# Patient Record
Sex: Female | Born: 1990
Health system: Southern US, Community
[De-identification: ages and names within clinical notes are randomized; demographics above are authoritative.]

## PROBLEM LIST (undated history)

## (undated) ENCOUNTER — Inpatient Hospital Stay (HOSPITAL_COMMUNITY): Payer: Self-pay

## (undated) DIAGNOSIS — G5600 Carpal tunnel syndrome, unspecified upper limb: Secondary | ICD-10-CM

## (undated) DIAGNOSIS — O26899 Other specified pregnancy related conditions, unspecified trimester: Secondary | ICD-10-CM

## (undated) HISTORY — PX: NO PAST SURGERIES: SHX2092

## (undated) HISTORY — DX: Other specified pregnancy related conditions, unspecified trimester: O26.899

## (undated) HISTORY — DX: Carpal tunnel syndrome, unspecified upper limb: G56.00

---
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
## 2013-12-02 ENCOUNTER — Telehealth: Payer: Self-pay | Admitting: *Deleted

## 2013-12-02 NOTE — Telephone Encounter (Addendum)
Pt request rx Naftin.   Last rx for Naftin was written 08/20/2012.  Informed pt, refills of Naftin 2% 90grams apply bid to affected area, ordered by Dr. Al CorpusHyatt for prn 1 year. Pt request orders to [facility] [address]  Called to [phone].

## 2013-12-02 NOTE — Telephone Encounter (Signed)
Ok you may refill

## 2013-12-03 ENCOUNTER — Encounter: Payer: Self-pay | Admitting: *Deleted

## 2015-10-13 LAB — OB RESULTS CONSOLE RUBELLA ANTIBODY, IGM: Rubella: IMMUNE

## 2015-10-13 LAB — OB RESULTS CONSOLE HEPATITIS B SURFACE ANTIGEN: HEP B S AG: NEGATIVE

## 2015-10-13 LAB — OB RESULTS CONSOLE GC/CHLAMYDIA
CHLAMYDIA, DNA PROBE: NEGATIVE
GC PROBE AMP, GENITAL: NEGATIVE

## 2015-10-13 LAB — OB RESULTS CONSOLE HIV ANTIBODY (ROUTINE TESTING): HIV: NONREACTIVE

## 2015-10-13 LAB — OB RESULTS CONSOLE ABO/RH: RH Type: POSITIVE

## 2015-10-13 LAB — OB RESULTS CONSOLE ANTIBODY SCREEN: ANTIBODY SCREEN: NEGATIVE

## 2015-10-13 LAB — OB RESULTS CONSOLE RPR: RPR: NONREACTIVE

## 2015-11-07 NOTE — L&D Delivery Note (Signed)
Delivery Note At 1:45 AM a viable female, "Brycen", was delivered via Vaginal, Spontaneous Delivery (Presentation: LOA).  APGAR: 9, 9; weight  .   Placenta status: Spontaneous, intact.  Cord: CAN x 1, removed over vtx at delivery, with the following complications: None  Cord pH: None  Anesthesia:  Epidural  Episiotomy:  None Lacerations:  Bilateral periurethral lacerations Suture Repair: 3.0 vicryl Est. Blood Loss (mL):  350  Mom to postpartum.  Baby to Couplet care / Skin to Skin. Family plans outpatient circumcision. Patient anticipates using progesterone-only OCPs for contraception.  Taela Charbonneau 06/10/2016, 2:25 AM

## 2016-05-11 LAB — OB RESULTS CONSOLE GBS: STREP GROUP B AG: POSITIVE

## 2016-06-05 ENCOUNTER — Telehealth (HOSPITAL_COMMUNITY): Payer: Self-pay | Admitting: *Deleted

## 2016-06-05 NOTE — Telephone Encounter (Signed)
Preadmission screen  

## 2016-06-06 ENCOUNTER — Telehealth (HOSPITAL_COMMUNITY): Payer: Self-pay | Admitting: *Deleted

## 2016-06-06 ENCOUNTER — Encounter (HOSPITAL_COMMUNITY): Payer: Self-pay | Admitting: *Deleted

## 2016-06-06 NOTE — Telephone Encounter (Signed)
Preadmission screen  

## 2016-06-07 ENCOUNTER — Other Ambulatory Visit: Payer: Self-pay | Admitting: Obstetrics and Gynecology

## 2016-06-09 ENCOUNTER — Inpatient Hospital Stay (HOSPITAL_COMMUNITY): Payer: Medicaid Other | Admitting: Anesthesiology

## 2016-06-09 ENCOUNTER — Inpatient Hospital Stay (HOSPITAL_COMMUNITY)
Admission: RE | Admit: 2016-06-09 | Discharge: 2016-06-12 | DRG: 775 | Disposition: A | Discharge status: Home / Self Care | Payer: Medicaid Other | Source: Ambulatory Visit | Attending: Obstetrics and Gynecology | Admitting: Obstetrics and Gynecology

## 2016-06-09 DIAGNOSIS — O99824 Streptococcus B carrier state complicating childbirth: Secondary | ICD-10-CM | POA: Diagnosis present

## 2016-06-09 DIAGNOSIS — Z833 Family history of diabetes mellitus: Secondary | ICD-10-CM

## 2016-06-09 DIAGNOSIS — O480 Post-term pregnancy: Secondary | ICD-10-CM | POA: Diagnosis present

## 2016-06-09 DIAGNOSIS — Z87891 Personal history of nicotine dependence: Secondary | ICD-10-CM | POA: Diagnosis not present

## 2016-06-09 DIAGNOSIS — O26899 Other specified pregnancy related conditions, unspecified trimester: Secondary | ICD-10-CM | POA: Diagnosis not present

## 2016-06-09 DIAGNOSIS — Z6836 Body mass index (BMI) 36.0-36.9, adult: Secondary | ICD-10-CM

## 2016-06-09 DIAGNOSIS — E669 Obesity, unspecified: Secondary | ICD-10-CM | POA: Diagnosis present

## 2016-06-09 DIAGNOSIS — O99214 Obesity complicating childbirth: Secondary | ICD-10-CM | POA: Diagnosis present

## 2016-06-09 DIAGNOSIS — O09899 Supervision of other high risk pregnancies, unspecified trimester: Secondary | ICD-10-CM | POA: Diagnosis present

## 2016-06-09 DIAGNOSIS — G5600 Carpal tunnel syndrome, unspecified upper limb: Secondary | ICD-10-CM | POA: Diagnosis present

## 2016-06-09 DIAGNOSIS — B951 Streptococcus, group B, as the cause of diseases classified elsewhere: Secondary | ICD-10-CM | POA: Diagnosis present

## 2016-06-09 DIAGNOSIS — Z3A41 41 weeks gestation of pregnancy: Secondary | ICD-10-CM | POA: Diagnosis not present

## 2016-06-09 DIAGNOSIS — Z283 Underimmunization status: Secondary | ICD-10-CM

## 2016-06-09 DIAGNOSIS — Z2839 Other underimmunization status: Secondary | ICD-10-CM | POA: Diagnosis present

## 2016-06-09 DIAGNOSIS — Z3490 Encounter for supervision of normal pregnancy, unspecified, unspecified trimester: Secondary | ICD-10-CM | POA: Diagnosis present

## 2016-06-09 LAB — TYPE AND SCREEN
ABO/RH(D): O POS
ANTIBODY SCREEN: NEGATIVE

## 2016-06-09 LAB — PROTEIN / CREATININE RATIO, URINE
Creatinine, Urine: 98 mg/dL
PROTEIN CREATININE RATIO: 0.14 mg/mg{creat} (ref 0.00–0.15)
Total Protein, Urine: 14 mg/dL

## 2016-06-09 LAB — CBC
HEMATOCRIT: 37.8 % (ref 36.0–46.0)
Hemoglobin: 13 g/dL (ref 12.0–15.0)
MCH: 30.4 pg (ref 26.0–34.0)
MCHC: 34.4 g/dL (ref 30.0–36.0)
MCV: 88.3 fL (ref 78.0–100.0)
PLATELETS: 163 10*3/uL (ref 150–400)
RBC: 4.28 MIL/uL (ref 3.87–5.11)
RDW: 14.5 % (ref 11.5–15.5)
WBC: 9.6 10*3/uL (ref 4.0–10.5)

## 2016-06-09 LAB — COMPREHENSIVE METABOLIC PANEL
ALBUMIN: 2.9 g/dL — AB (ref 3.5–5.0)
ALK PHOS: 199 U/L — AB (ref 38–126)
ALT: 28 U/L (ref 14–54)
ANION GAP: 8 (ref 5–15)
AST: 23 U/L (ref 15–41)
BUN: 11 mg/dL (ref 6–20)
CO2: 19 mmol/L — AB (ref 22–32)
Calcium: 8.9 mg/dL (ref 8.9–10.3)
Chloride: 103 mmol/L (ref 101–111)
Creatinine, Ser: 0.55 mg/dL (ref 0.44–1.00)
GFR calc Af Amer: >60 mL/min (ref >60)
GFR calc non Af Amer: >60 mL/min (ref >60)
GLUCOSE: 91 mg/dL (ref 65–99)
POTASSIUM: 4 mmol/L (ref 3.5–5.1)
SODIUM: 130 mmol/L — AB (ref 135–145)
Total Bilirubin: 0.6 mg/dL (ref 0.3–1.2)
Total Protein: 6.2 g/dL — ABNORMAL LOW (ref 6.5–8.1)

## 2016-06-09 LAB — LACTATE DEHYDROGENASE: LDH: 130 U/L (ref 98–192)

## 2016-06-09 LAB — ABO/RH: ABO/RH(D): O POS

## 2016-06-09 LAB — URIC ACID: Uric Acid, Serum: 3.9 mg/dL (ref 2.3–6.6)

## 2016-06-09 LAB — RPR: RPR Ser Ql: NONREACTIVE

## 2016-06-09 MED ORDER — ZOLPIDEM TARTRATE 5 MG PO TABS
5.0000 mg | ORAL_TABLET | Freq: Every evening | ORAL | Status: DC | PRN
  Administered 2016-06-09: 5 mg via ORAL
  Filled 2016-06-09: qty 1

## 2016-06-09 MED ORDER — LIDOCAINE HCL (PF) 1 % IJ SOLN
INTRAMUSCULAR | Status: DC | PRN
  Administered 2016-06-09 (×2): 5 mL via EPIDURAL

## 2016-06-09 MED ORDER — TERBUTALINE SULFATE 1 MG/ML IJ SOLN: 0.2500 mg | Freq: Once | INTRAMUSCULAR | Status: DC | PRN

## 2016-06-09 MED ORDER — DIPHENHYDRAMINE HCL 50 MG/ML IJ SOLN: 12.5000 mg | INTRAMUSCULAR | Status: DC | PRN

## 2016-06-09 MED ORDER — LIDOCAINE HCL (PF) 1 % IJ SOLN
30.0000 mL | INTRAMUSCULAR | Status: AC | PRN
  Administered 2016-06-10: 30 mL via SUBCUTANEOUS
  Filled 2016-06-09: qty 30

## 2016-06-09 MED ORDER — EPHEDRINE 5 MG/ML INJ
10.0000 mg | INTRAVENOUS | Status: DC | PRN
Start: 2016-06-09 — End: 2016-06-09

## 2016-06-09 MED ORDER — OXYTOCIN 40 UNITS IN LACTATED RINGERS INFUSION - SIMPLE MED: 1.0000 m[IU]/min | INTRAVENOUS | Status: DC

## 2016-06-09 MED ORDER — OXYTOCIN BOLUS FROM INFUSION
500.0000 mL | Freq: Once | INTRAVENOUS | Status: AC
  Administered 2016-06-10: 500 mL via INTRAVENOUS

## 2016-06-09 MED ORDER — LACTATED RINGERS IV SOLN: 500.0000 mL | Freq: Once | INTRAVENOUS | Status: DC

## 2016-06-09 MED ORDER — FENTANYL 2.5 MCG/ML BUPIVACAINE 1/10 % EPIDURAL INFUSION (WH - ANES)
14.0000 mL/h | INTRAMUSCULAR | Status: DC | PRN
  Administered 2016-06-09 (×2): 14 mL/h via EPIDURAL
  Filled 2016-06-09 (×3): qty 125

## 2016-06-09 MED ORDER — MISOPROSTOL 25 MCG QUARTER TABLET
25.0000 ug | ORAL_TABLET | ORAL | Status: DC | PRN
  Administered 2016-06-09 (×3): 25 ug via VAGINAL
  Filled 2016-06-09 (×3): qty 0.25

## 2016-06-09 MED ORDER — LACTATED RINGERS IV SOLN
500.0000 mL | Freq: Once | INTRAVENOUS | Status: AC
  Administered 2016-06-09: 500 mL via INTRAVENOUS

## 2016-06-09 MED ORDER — PHENYLEPHRINE 40 MCG/ML (10ML) SYRINGE FOR IV PUSH (FOR BLOOD PRESSURE SUPPORT)
80.0000 ug | PREFILLED_SYRINGE | INTRAVENOUS | Status: DC | PRN
  Filled 2016-06-09: qty 10

## 2016-06-09 MED ORDER — PENICILLIN G POTASSIUM 5000000 UNITS IJ SOLR: 2.5000 10*6.[IU] | INTRAVENOUS | Status: DC

## 2016-06-09 MED ORDER — OXYTOCIN 40 UNITS IN LACTATED RINGERS INFUSION - SIMPLE MED
1.0000 m[IU]/min | INTRAVENOUS | Status: DC
Start: 2016-06-09 — End: 2016-06-10
  Administered 2016-06-09: 1 m[IU]/min via INTRAVENOUS
  Filled 2016-06-09: qty 1000

## 2016-06-09 MED ORDER — OXYTOCIN 40 UNITS IN LACTATED RINGERS INFUSION - SIMPLE MED: 2.5000 [IU]/h | INTRAVENOUS | Status: DC

## 2016-06-09 MED ORDER — ZOLPIDEM TARTRATE 5 MG PO TABS: 5.0000 mg | ORAL_TABLET | Freq: Every evening | ORAL | Status: DC | PRN

## 2016-06-09 MED ORDER — EPHEDRINE 5 MG/ML INJ: 10.0000 mg | INTRAVENOUS | Status: DC | PRN

## 2016-06-09 MED ORDER — LACTATED RINGERS IV SOLN
INTRAVENOUS | Status: DC
  Administered 2016-06-09: 06:00:00 via INTRAVENOUS
  Administered 2016-06-09: 500 mL via INTRAVENOUS
  Administered 2016-06-09 (×2): via INTRAVENOUS

## 2016-06-09 MED ORDER — LACTATED RINGERS IV SOLN: 500.0000 mL | INTRAVENOUS | Status: DC | PRN

## 2016-06-09 MED ORDER — DEXTROSE 5 % IV SOLN
5.0000 10*6.[IU] | Freq: Once | INTRAVENOUS | Status: AC
  Administered 2016-06-09: 5 10*6.[IU] via INTRAVENOUS
  Filled 2016-06-09: qty 5

## 2016-06-09 MED ORDER — PHENYLEPHRINE 40 MCG/ML (10ML) SYRINGE FOR IV PUSH (FOR BLOOD PRESSURE SUPPORT): 80.0000 ug | PREFILLED_SYRINGE | INTRAVENOUS | Status: DC | PRN

## 2016-06-09 MED ORDER — FENTANYL CITRATE (PF) 100 MCG/2ML IJ SOLN
50.0000 ug | INTRAMUSCULAR | Status: DC | PRN
  Administered 2016-06-09 (×2): 100 ug via INTRAVENOUS
  Filled 2016-06-09 (×2): qty 2

## 2016-06-09 MED ORDER — FENTANYL 2.5 MCG/ML BUPIVACAINE 1/10 % EPIDURAL INFUSION (WH - ANES): 14.0000 mL/h | INTRAMUSCULAR | Status: DC | PRN

## 2016-06-09 MED ORDER — FLEET ENEMA 7-19 GM/118ML RE ENEM: 1.0000 | ENEMA | RECTAL | Status: DC | PRN

## 2016-06-09 MED ORDER — ONDANSETRON HCL 4 MG/2ML IJ SOLN: 4.0000 mg | Freq: Four times a day (QID) | INTRAMUSCULAR | Status: DC | PRN

## 2016-06-09 MED ORDER — PENICILLIN G POTASSIUM 5000000 UNITS IJ SOLR
2.5000 10*6.[IU] | INTRAVENOUS | Status: DC
  Administered 2016-06-09 – 2016-06-10 (×3): 2.5 10*6.[IU] via INTRAVENOUS
  Filled 2016-06-09 (×6): qty 2.5

## 2016-06-09 MED ORDER — PHENYLEPHRINE 40 MCG/ML (10ML) SYRINGE FOR IV PUSH (FOR BLOOD PRESSURE SUPPORT)
80.0000 ug | PREFILLED_SYRINGE | INTRAVENOUS | Status: DC | PRN
Start: 2016-06-09 — End: 2016-06-09

## 2016-06-09 MED ORDER — ACETAMINOPHEN 325 MG PO TABS: 650.0000 mg | ORAL_TABLET | ORAL | Status: DC | PRN

## 2016-06-09 MED ORDER — SOD CITRATE-CITRIC ACID 500-334 MG/5ML PO SOLN: 30.0000 mL | ORAL | Status: DC | PRN

## 2016-06-09 NOTE — Anesthesia Pain Management Evaluation Note (Signed)
  CRNA Pain Management Visit Note  Patient: Deborah Lozano, 25 y.o., female  "Hello I am a member of the anesthesia team at [facility]. We have an anesthesia team available at all times to provide care throughout the hospital, including epidural management and anesthesia for C-section. I don't know your plan for the delivery whether it a natural birth, water birth, IV sedation, nitrous supplementation, doula or epidural, but we want to meet your pain goals."   1.Was your pain managed to your expectations on prior hospitalizations?   Yes   2.What is your expectation for pain management during this hospitalization?     Epidural  3.How can we help you reach that goal? epidural  Record the patient's initial score and the patient's pain goal.   Pain: 0  Pain Goal: 8 [facility] wants you to be able to say your pain was always managed very well.  Salome Arnt 06/09/2016

## 2016-06-09 NOTE — Progress Notes (Signed)
  Subjective: Sleeping, yet easily aroused. Ambien helpful. Feeling ctxs, but bearable. +FM. Denies HA, visual changes, epigastric pain or difficulty breathing.   Objective: BP 133/71   Pulse 99   Temp 98.6 F (37 C) (Oral)   Resp 18   Ht 5\' 5"  (1.651 m)   Wt 98.9 kg (218 lb)   BMI 36.28 kg/m   Today's Vitals   06/09/16 0500 06/09/16 0502 06/09/16 0532 06/09/16 0602  BP:  133/64 (!) 143/73 133/71  Pulse:  99 97 99  Resp:  18 16 18   Temp:    98.6 F (37 C)  TempSrc:    Oral  Weight:      Height:      PainSc: Asleep      No intake/output data recorded. No intake/output data recorded.  FHT: BL 150 w/ moderate variability, +accels, few, non-repetitive mild variables, no lates UC:   irregular, every 1-7 minutes SVE:   Dilation: 1.5 Effacement (%): 60 Station: -3 Exam by:: B Stalling RN@ [id number]  Cytotec #2 placed at 0614; cvx unchanged.  Assessment:  Elevated BPs Overall reassuring FHRT  Plan: preE labs now  Sherre Scarlet CNM 06/09/2016, 6:29 AM

## 2016-06-09 NOTE — Anesthesia Preprocedure Evaluation (Signed)
Anesthesia Evaluation  Patient identified by MRN, date of birth, ID band Patient awake    Reviewed: Allergy & Precautions, H&P , NPO status , Patient's Chart, lab work & pertinent test results  Airway Mallampati: II  TM Distance: >3 FB Neck ROM: full    Dental no notable dental hx. (+) Teeth Intact, Dental Advisory Given   Pulmonary neg pulmonary ROS, former smoker,    Pulmonary exam normal breath sounds clear to auscultation       Cardiovascular Exercise Tolerance: Good negative cardio ROS Normal cardiovascular exam Rhythm:regular Rate:Normal     Neuro/Psych negative neurological ROS  negative psych ROS   GI/Hepatic negative GI ROS, Neg liver ROS,   Endo/Other  negative endocrine ROS  Renal/GU negative Renal ROS  negative genitourinary   Musculoskeletal   Abdominal   Peds  Hematology negative hematology ROS (+)   Anesthesia Other Findings   Reproductive/Obstetrics (+) Pregnancy                             Anesthesia Physical Anesthesia Plan  ASA: II  Anesthesia Plan: Epidural   Post-op Pain Management:    Induction:   Airway Management Planned:   Additional Equipment:   Intra-op Plan:   Post-operative Plan:   Informed Consent: I have reviewed the patients History and Physical, chart, labs and discussed the procedure including the risks, benefits and alternatives for the proposed anesthesia with the patient or authorized representative who has indicated his/her understanding and acceptance.   Dental Advisory Given  Plan Discussed with: CRNA  Anesthesia Plan Comments:         Anesthesia Quick Evaluation

## 2016-06-09 NOTE — H&P (Signed)
Deborah Lozano is a 25 y.o. female, G2P0010 @ 41.0 wks estimated gestational age (as dated by LMP (08/27/15) c/w 22.6 week ultrasound, EDD 06/02/16), presenting for IOL due to late term pregnancy. Endorses FM and ctxs. Denies HA, visual changes, epigastric pain, difficulty breathing, VB or LOF.  Pt entered care at [facility] at 10 wks.  Prenatal course c/b: GBS positive status Obesity (BMI 35.8) -- TWG 79 lbs Varicella non-immune Carpal tunnel syndrome Insomnia  OB History    Gravida Para Term Preterm AB Living   1             SAB TAB Ectopic Multiple Live Births                TAB on 07/15/14 @ 4 wks Past Medical History:  Diagnosis Date  . Carpal tunnel syndrome during pregnancy   . Medical history non-contributory    Past Surgical History:  Procedure Laterality Date  . NO PAST SURGERIES     Family History: family history includes Cancer in her paternal aunt; Diabetes in her maternal grandfather, paternal aunt, and paternal grandmother. Social History:  reports that she quit smoking about a year ago. She has never used smokeless tobacco. She reports that she does not drink alcohol or use drugs.     Maternal Diabetes: No Genetic Screening: Declined Maternal Ultrasounds/Referrals: Normal -- Anatomy: EFW 1+3, 40%ILE. NORMAL FLUID. ANTERIOR PLACENTA. FEMALE FETUS. LIMITED ANATOMY OF FACE AND HEART -- REPEATED AT 25.6 WKS - ALL ANATOMY SEEN - COMPLETED. Fetal Ultrasounds or other Referrals:  None Maternal Substance Abuse:  No Significant Maternal Medications:  Meds include: Other: PNV Significant Maternal Lab Results:  Lab values include: Group B Strep positive, varicella non-immune Other Comments:  Declined both flu and Tdap  ROS 10 Systems reviewed and are negative for acute change except as noted in the HPI.    Maternal Medical History:  Contractions: Onset was 1 week or more ago.   Frequency: irregular.   Duration is approximately 60 seconds.   Perceived severity is mild.     Fetal activity: Perceived fetal activity is normal.   Last perceived fetal movement was within the past hour.    Prenatal complications: No bleeding.   Prenatal Complications - Diabetes: none.    Dilation: 1.5 Effacement (%): 60 Station: -3 Exam by:: Leann RN@ 0156 Blood pressure 126/62, pulse 90, temperature 98.5 F (36.9 C), temperature source Oral, resp. rate 16, height  (1.651 m), weight 98.9 kg (218 lb). Maternal Exam:  Uterine Assessment: Contraction strength is mild.  Contraction duration is 60 seconds. Contraction frequency is irregular.   Abdomen: Patient reports no abdominal tenderness. Fundal height is CWD.   Estimated fetal weight is 7 1/2 - 8 lbs.   Fetal presentation: vertex  Introitus: Normal vulva. Normal vagina.  Pelvis: adequate for delivery.   Cervix: Cervix evaluated by digital exam.     Fetal Exam Fetal Monitor Review: Mode: fetoscope.   Baseline rate: 135.  Variability: moderate (6-25 bpm).   Pattern: accelerations present and no decelerations.    Fetal State Assessment: Category I - tracings are normal.     Physical Exam  Nursing note and vitals reviewed. Constitutional: She is oriented to person, place, and time. She appears well-developed and well-nourished. No distress.  HENT:  Head: Normocephalic and atraumatic.  Neck: Normal range of motion. Neck supple.  Cardiovascular: Normal rate, regular rhythm and normal heart sounds.  Exam reveals no gallop and no friction rub.   No  murmur heard. Respiratory: Effort normal and breath sounds normal.  GI: Soft. She exhibits no distension and no mass. There is no tenderness. There is no rebound and no guarding.  Genitourinary: Vagina normal and uterus normal.  Musculoskeletal: Normal range of motion. She exhibits edema.  Neurological: She is alert and oriented to person, place, and time. She has normal reflexes.  Skin: Skin is warm and dry.  Psychiatric: She has a normal mood and affect.    Bishop score: 2   Prenatal labs: ABO, Rh: --/--/O POS, O POS (08/04 0107) Antibody: NEG (08/04 0107) Rubella: Immune (12/07 0000) RPR: Nonreactive (12/07 0000)  HBsAg: Negative (12/07 0000)  HIV: Non-reactive (12/07 0000)  GBS: Positive (07/06 0000)  Varicella: Non-immune  Assessment: 25 yo G2P0010 @ 41.0 wks IOL due to late term pregnancy Latent phase labor FWB: Cat 1 GBS positive Unfavorable cvx Varicella non-immune Obesity Elevated BP x1 noted on admission    Plan: Admit to [facility] per consult with Dr. Normand Sloop. Routine [facility] orders. Ambien for sleep, however, not if pain med desired. Epidural upon request. Reviewed plan of care with patient & FOB ([place]), including rationale and processes of induction, to include Cytotec, foley bulb, pitocin and AROM. Risks and benefits of induction were reviewed, including failure of method, prolonged labor, need for further intervention, and risk of cesarean.Patient and FOB seem to understand these risks and wish to proceed. Will begin induction w/ Cytotec.  Monitor BPs closely, and if more elevations, plan to rule out preeclampsia.Pt currently asymptomatic. PCN G for GBS prophylaxis per standard dosing with ROM, active labor or initiation of Pitocin.  Consult as indicated. Offer varicella vaccine pp. Expect progress and SVD.    Sherre Scarlet 06/09/2016, 4:07 AM

## 2016-06-09 NOTE — Progress Notes (Signed)
25 y.o. year old female,at [redacted]w[redacted]d gestation.  SUBJECTIVE:  C/O painful contractions.  OBJECTIVE:  BP (!) 161/98   Pulse 100   Temp 97.7 F (36.5 C) (Axillary)   Resp 18   Ht 5\' 5"  (1.651 m)   Wt 218 lb (98.9 kg)   BMI 36.28 kg/m   Fetal Heart Tones:  Cat 1  Contractions:          firm  Cx 5-6 cm according to the nurse  Results for orders placed or performed during the hospital encounter of 06/09/16 (from the past 24 hour(s))  CBC     Status: None   Collection Time: 06/09/16  1:07 AM  Result Value Ref Range   WBC 9.6 4.0 - 10.5 K/uL   RBC 4.28 3.87 - 5.11 MIL/uL   Hemoglobin 13.0 12.0 - 15.0 g/dL   HCT 07.8 67.5 - 44.9 %   MCV 88.3 78.0 - 100.0 fL   MCH 30.4 26.0 - 34.0 pg   MCHC 34.4 30.0 - 36.0 g/dL   RDW 20.1 00.7 - 12.1 %   Platelets 163 150 - 400 K/uL  RPR     Status: None   Collection Time: 06/09/16  1:07 AM  Result Value Ref Range   RPR Ser Ql Non Reactive Non Reactive  Type and screen     Status: None   Collection Time: 06/09/16  1:07 AM  Result Value Ref Range   ABO/RH(D) O POS    Antibody Screen NEG    Sample Expiration 06/12/2016   ABO/Rh     Status: None   Collection Time: 06/09/16  1:07 AM  Result Value Ref Range   ABO/RH(D) O POS   Comprehensive metabolic panel     Status: Abnormal   Collection Time: 06/09/16  6:59 AM  Result Value Ref Range   Sodium 130 (L) 135 - 145 mmol/L   Potassium 4.0 3.5 - 5.1 mmol/L   Chloride 103 101 - 111 mmol/L   CO2 19 (L) 22 - 32 mmol/L   Glucose, Bld 91 65 - 99 mg/dL   BUN 11 6 - 20 mg/dL   Creatinine, Ser 9.75 0.44 - 1.00 mg/dL   Calcium 8.9 8.9 - 88.3 mg/dL   Total Protein 6.2 (L) 6.5 - 8.1 g/dL   Albumin 2.9 (L) 3.5 - 5.0 g/dL   AST 23 15 - 41 U/L   ALT 28 14 - 54 U/L   Alkaline Phosphatase 199 (H) 38 - 126 U/L   Total Bilirubin 0.6 0.3 - 1.2 mg/dL   GFR calc non Af Amer >60 >60 mL/min   GFR calc Af Amer >60 >60 mL/min   Anion gap 8 5 - 15  Lactate dehydrogenase     Status: None   Collection Time:  06/09/16  6:59 AM  Result Value Ref Range   LDH 130 98 - 192 U/L  Uric acid     Status: None   Collection Time: 06/09/16  6:59 AM  Result Value Ref Range   Uric Acid, Serum 3.9 2.3 - 6.6 mg/dL    ASSESSMENT:  [redacted]w[redacted]d Weeks Pregnancy  AROM - clear fluid earlier.  IUPC and FSE applied  PLAN:  The patietn requests an epidural for pain management.  Anticipate a vaginal delivery.  Leonard Schwartz, M.D.

## 2016-06-09 NOTE — Progress Notes (Signed)
  Subjective: Comfortable with epidural, feeling slight vagina pressure.  Family and FOB at bedside.  Objective: BP 137/81   Pulse 90   Temp 98 F (36.7 C) (Axillary)   Resp 18   Ht 5\' 5"  (1.651 m)   Wt 98.9 kg (218 lb)   BMI 36.28 kg/m  I/O last 3 completed shifts: In: 400 [P.O.:300; IV Piggyback:100] Out: 1000 [Urine:1000] No intake/output data recorded.   Vitals:   06/09/16 1730 06/09/16 1800 06/09/16 1830 06/09/16 1900  BP: 136/86 119/62 133/79 137/81  Pulse: 100 90 (!) 105 90  Resp: 18 18 18 18   Temp:  98 F (36.7 C)    TempSrc:  Axillary    Weight:      Height:        FHT: Category--segments of Category 1, segments of decreased variability, early decels UC:   regular, every 2-4 minutes SVE:   Dilation: 6.5 Effacement (%): 90 Station: -1, 0 Exam by:: Lajuana Matte, RN at 7pm MVUs 200-250 since 1630. FSE in place  Assessment:  Active labor, with adequate contractions GBS positive BMI 35.8 Mildly elevated BP earlier today--normal PIH labs, no PCR done  Plan: Check PCR Continue to observe for labor progress. Position to facilitate rotation/descent. Augment prn. Recheck cervix in 2 hours or prn  Kathan Kirker CNM 06/09/2016, 7:40p

## 2016-06-09 NOTE — Anesthesia Procedure Notes (Addendum)
Epidural Patient location during procedure: OB Start time: 06/09/2016 2:55 PM End time: 06/09/2016 3:05 PM  Staffing Anesthesiologist: Ronelle Nigh Performed: anesthesiologist   Preanesthetic Checklist Completed: patient identified, site marked, surgical consent, pre-op evaluation, timeout performed, IV checked, risks and benefits discussed and monitors and equipment checked  Epidural Patient position: sitting Prep: site prepped and draped and DuraPrep Patient monitoring: continuous pulse ox and blood pressure Approach: midline Location: L3-L4 Injection technique: LOR air  Needle:  Needle type: Tuohy  Needle gauge: 17 G Needle length: 9 cm and 9 Needle insertion depth: 7 cm Catheter type: closed end flexible Catheter size: 19 Gauge Catheter at skin depth: 13 cm Test dose: negative  Assessment Sensory level: T10 Events: blood not aspirated, injection not painful, no injection resistance, negative IV test and no paresthesia  Additional Notes Patient identified. Risks/Benefits/Options discussed with patient including but not limited to bleeding, infection, nerve damage, paralysis, failed block, incomplete pain control, headache, blood pressure changes, nausea, vomiting, reactions to medication both or allergic, itching and postpartum back pain. Confirmed with bedside nurse the patient's most recent platelet count. Confirmed with patient that they are not currently taking any anticoagulation, have any bleeding history or any family history of bleeding disorders. Patient expressed understanding and wished to proceed. All questions were answered. Sterile technique was used throughout the entire procedure. Please see nursing notes for vital signs. Test dose was given through epidural catheter and negative prior to continuing to dose epidural or start infusion. Warning signs of high block given to the patient including shortness of breath, tingling/numbness in hands, complete motor block, or  any concerning symptoms with instructions to call for help. Patient was given instructions on fall risk and not to get out of bed. All questions and concerns addressed with instructions to call with any issues or inadequate analgesia.

## 2016-06-09 NOTE — Progress Notes (Signed)
  Subjective: Comfortable with epidural.  Objective: BP 137/81   Pulse 90   Temp 97.7 F (36.5 C) (Oral)   Resp 18   Ht 5\' 5"  (1.651 m)   Wt 98.9 kg (218 lb)   BMI 36.28 kg/m  I/O last 3 completed shifts: In: 400 [P.O.:300; IV Piggyback:100] Out: 1000 [Urine:1000] No intake/output data recorded.  FHT: Category 1 UC:   Coupling noted SVE:   Dilation: 7 Effacement (%): 100 Station: -1 Exam by:: V.Kohler Pellerito, CNM   Assessment:  Active labor, coupling of contractions.  Plan: Augment with pitocin Position to facilitate rotation/descent.  Nigel Bridgeman CNM 06/09/2016, 9:53 PM

## 2016-06-10 ENCOUNTER — Encounter (HOSPITAL_COMMUNITY): Payer: Self-pay | Admitting: Anesthesiology

## 2016-06-10 ENCOUNTER — Encounter (HOSPITAL_COMMUNITY): Payer: Self-pay

## 2016-06-10 MED ORDER — BENZOCAINE-MENTHOL 20-0.5 % EX AERO
1.0000 "application " | INHALATION_SPRAY | CUTANEOUS | Status: DC | PRN
  Administered 2016-06-10: 1 via TOPICAL
  Filled 2016-06-10 (×2): qty 56

## 2016-06-10 MED ORDER — TETANUS-DIPHTH-ACELL PERTUSSIS 5-2.5-18.5 LF-MCG/0.5 IM SUSP: 0.5000 mL | Freq: Once | INTRAMUSCULAR | Status: DC

## 2016-06-10 MED ORDER — COCONUT OIL OIL
1.0000 "application " | TOPICAL_OIL | Status: DC | PRN
  Administered 2016-06-11: 1 via TOPICAL
  Filled 2016-06-10 (×2): qty 120

## 2016-06-10 MED ORDER — ACETAMINOPHEN 325 MG PO TABS: 650.0000 mg | ORAL_TABLET | ORAL | Status: DC | PRN

## 2016-06-10 MED ORDER — WITCH HAZEL-GLYCERIN EX PADS: 1.0000 "application " | MEDICATED_PAD | CUTANEOUS | Status: DC | PRN

## 2016-06-10 MED ORDER — OXYCODONE HCL 5 MG PO TABS: 10.0000 mg | ORAL_TABLET | ORAL | Status: DC | PRN

## 2016-06-10 MED ORDER — PRENATAL MULTIVITAMIN CH: 1.0000 | ORAL_TABLET | Freq: Every day | ORAL | Status: DC

## 2016-06-10 MED ORDER — ONDANSETRON HCL 4 MG PO TABS: 4.0000 mg | ORAL_TABLET | ORAL | Status: DC | PRN

## 2016-06-10 MED ORDER — PRENATAL MULTIVITAMIN CH
1.0000 | ORAL_TABLET | Freq: Every day | ORAL | Status: DC
  Administered 2016-06-10 – 2016-06-12 (×3): 1 via ORAL
  Filled 2016-06-10 (×3): qty 1

## 2016-06-10 MED ORDER — SIMETHICONE 80 MG PO CHEW: 80.0000 mg | CHEWABLE_TABLET | ORAL | Status: DC | PRN

## 2016-06-10 MED ORDER — OXYCODONE HCL 5 MG PO TABS: 5.0000 mg | ORAL_TABLET | ORAL | Status: DC | PRN

## 2016-06-10 MED ORDER — ZOLPIDEM TARTRATE 5 MG PO TABS: 5.0000 mg | ORAL_TABLET | Freq: Every evening | ORAL | Status: DC | PRN

## 2016-06-10 MED ORDER — IBUPROFEN 600 MG PO TABS
600.0000 mg | ORAL_TABLET | Freq: Four times a day (QID) | ORAL | Status: DC
  Administered 2016-06-10 – 2016-06-12 (×10): 600 mg via ORAL
  Filled 2016-06-10 (×10): qty 1

## 2016-06-10 MED ORDER — DIPHENHYDRAMINE HCL 25 MG PO CAPS: 25.0000 mg | ORAL_CAPSULE | Freq: Four times a day (QID) | ORAL | Status: DC | PRN

## 2016-06-10 MED ORDER — ONDANSETRON HCL 4 MG/2ML IJ SOLN: 4.0000 mg | INTRAMUSCULAR | Status: DC | PRN

## 2016-06-10 MED ORDER — DIBUCAINE 1 % RE OINT
1.0000 "application " | TOPICAL_OINTMENT | RECTAL | Status: DC | PRN
  Filled 2016-06-10: qty 56.7

## 2016-06-10 MED ORDER — SENNOSIDES-DOCUSATE SODIUM 8.6-50 MG PO TABS
2.0000 | ORAL_TABLET | ORAL | Status: DC
  Administered 2016-06-10 – 2016-06-12 (×2): 2 via ORAL
  Filled 2016-06-10 (×2): qty 2

## 2016-06-10 NOTE — Lactation Note (Addendum)
This note was copied from a baby's chart. Lactation Consultation Note Initial consult with this mom and term baby, now 23 hours old. Mom has flat nipples and has had trouble getting this term baby to latch. I first tried a 24 shield, but it would lose suction. The 20 shield worked better. Aggie Hacker was on and off both breasts, for 60 minutes, with about 15 minutes of actual, strong sucking. He was able to take about 4 ml's of EBM by me placing it into the nipple shield with curved tip syringe. He was actively trying to latch after 30 minutes or so. I left mom with him latched in lay back position, and dad was helping to protect baby's airway - mom sleepy, breast soft and full.  DEP set up and mom is to begin pumping after she eats and naps. Mom may need help applying nipple shield later, and Chalmers Guest., LC, aware. Mom has lots of easily expressed colostrum. She knows to call for questions/concerns. Dad is [place], who works Office manager, 3rd [employer].  I noted the baby has a short, thin frenulum, just behind his tongue tip, but he does well with latching deeply with 20 nipple shield.   Patient Name: Deborah Lozano [id number] Date: 06/10/2016 Reason for consult: Initial assessment   Maternal Data Formula Feeding for Exclusion: No Has patient been taught Hand Expression?: Yes Does the patient have breastfeeding experience prior to this delivery?: No  Feeding Feeding Type: Breast Fed Length of feed: 60 min (on and off - about 15 minutes of sucking)  LATCH Score/Interventions Latch: Repeated attempts needed to sustain latch, nipple held in mouth throughout feeding, stimulation needed to elicit sucking reflex. Intervention(s): Adjust position;Assist with latch;Breast massage;Breast compression  Audible Swallowing: A few with stimulation (EBM in nipple shield) Intervention(s): Skin to skin;Hand expression  Type of Nipple: Flat (20 nipple shiled great help) Intervention(s): Shells (shells in room, will educate  mom about shells tomrrow. )  Comfort (Breast/Nipple): Soft / non-tender     Hold (Positioning): Assistance needed to correctly position infant at breast and maintain latch. Intervention(s): Breastfeeding basics reviewed;Support Pillows;Position options;Skin to skin  LATCH Score: 6  Lactation Tools Discussed/Used WIC Program: Yes Pump Review: Setup, frequency, and cleaning;Milk Storage;Other (comment) (nipple shiled application and care, pump use and settings, hand expression) Initiated by:: Danton Clap, RN, IBCLC Date initiated:: 06/10/16   Consult Status Consult Status: Follow-up Date: 06/11/16 Follow-up type: In-patient    Alfred Levins 06/10/2016, 4:48 PM

## 2016-06-10 NOTE — Anesthesia Postprocedure Evaluation (Signed)
Anesthesia Post Note  Patient: Deborah Lozano  Procedure(s) Performed: * No procedures listed *  Patient location during evaluation: Mother Baby Anesthesia Type: Epidural Level of consciousness: awake and alert Pain management: pain level controlled Vital Signs Assessment: post-procedure vital signs reviewed and stable Respiratory status: spontaneous breathing, nonlabored ventilation and respiratory function stable Cardiovascular status: stable Postop Assessment: no headache, no backache, epidural receding and patient able to bend at knees Anesthetic complications: no     Last Vitals:  Vitals:   06/10/16 1032 06/10/16 1500  BP: 136/65 130/63  Pulse: 93 100  Resp: 18 18  Temp: 36.8 C 37.1 C    Last Pain:  Vitals:   06/10/16 1551  TempSrc:   PainSc: 2    Pain Goal: Patients Stated Pain Goal: 0 (06/09/16 2001)               Rica Records

## 2016-06-10 NOTE — Progress Notes (Signed)
Postpartum day #0, NSVD  Subjective Pt without complaints.  Lochia normal.  Pain controlled.  Breast feeding yes  Temp:  [97.1 F (36.2 C)-99.5 F (37.5 C)] 98.2 F (36.8 C) (08/05 1032) Pulse Rate:  [84-109] 93 (08/05 1032) Resp:  [16-20] 18 (08/05 1032) BP: (112-163)/(43-98) 136/65 (08/05 1032)  Gen:  NAD, A&O x 3 Uterine fundus:  Firm, nontender Lochia normal Ext:  2+Edema, no calf tenderness bilaterally  CBC    Component Value Date/Time   WBC 9.6 06/09/2016 0107   RBC 4.28 06/09/2016 0107   HGB 13.0 06/09/2016 0107   HCT 37.8 06/09/2016 0107   PLT 163 06/09/2016 0107   MCV 88.3 06/09/2016 0107   MCH 30.4 06/09/2016 0107   MCHC 34.4 06/09/2016 0107   RDW 14.5 06/09/2016 0107     A/P: S/p SVD doing well. Routine postpartum care. Lactation support. Desires outpatient circumcision.  Geryl Rankins 06/10/2016, 2:40 PM

## 2016-06-10 NOTE — Anesthesia Postprocedure Evaluation (Signed)
Anesthesia Post Note  Patient: Deborah Lozano  Procedure(s) Performed: * No procedures listed *  Patient location during evaluation: Mother Baby Anesthesia Type: Epidural Level of consciousness: awake and alert Pain management: satisfactory to patient Vital Signs Assessment: post-procedure vital signs reviewed and stable Respiratory status: respiratory function stable Cardiovascular status: stable Postop Assessment: no headache, no backache, epidural receding, patient able to bend at knees, no signs of nausea or vomiting and adequate PO intake Anesthetic complications: no     Last Vitals:  Vitals:   06/10/16 0630 06/10/16 1032  BP: (!) 115/43 136/65  Pulse: (!) 101 93  Resp: 18 18  Temp: 37.3 C 36.8 C    Last Pain:  Vitals:   06/10/16 1034  TempSrc:   PainSc: 2    Pain Goal: Patients Stated Pain Goal: 0 (06/09/16 2001)               Denitra Donaghey

## 2016-06-10 NOTE — Progress Notes (Addendum)
  Subjective: Feeling more pressure.  Objective: BP 137/81   Pulse 90   Temp 97.7 F (36.5 C) (Oral)   Resp 18   Ht 5\' 5"  (1.651 m)   Wt 98.9 kg (218 lb)   BMI 36.28 kg/m  I/O last 3 completed shifts: In: 400 [P.O.:300; IV Piggyback:100] Out: 1000 [Urine:1000] Total I/O In: -  Out: 300 [Urine:300]   BP range 110-130s/70-81.  FHT: Category 2--moderate variability, occasional variables, sporadic late decels with coupling UCs UC:   irregular, every 2-4 minutes SVE:   Dilation: 9 Effacement (%): 100 Station: 0 Exam by:: V.Lashena Signer, CNM--more cervix on right than left. Pitocin at 1 mu/min  PCR 0.14  Assessment:  Progressive labor Category 2 FHR  Plan: Position for fetal perfusion and to facilitate rotation.  Nigel Bridgeman CNM 06/10/2016, 12:09 AM

## 2016-06-11 LAB — CBC
HEMATOCRIT: 32.4 % — AB (ref 36.0–46.0)
Hemoglobin: 10.8 g/dL — ABNORMAL LOW (ref 12.0–15.0)
MCH: 29.4 pg (ref 26.0–34.0)
MCHC: 33.3 g/dL (ref 30.0–36.0)
MCV: 88.3 fL (ref 78.0–100.0)
Platelets: 131 10*3/uL — ABNORMAL LOW (ref 150–400)
RBC: 3.67 MIL/uL — ABNORMAL LOW (ref 3.87–5.11)
RDW: 15 % (ref 11.5–15.5)
WBC: 9.6 10*3/uL (ref 4.0–10.5)

## 2016-06-11 MED ORDER — IBUPROFEN 600 MG PO TABS: 600.0000 mg | ORAL_TABLET | Freq: Four times a day (QID) | ORAL | 0 refills | Status: DC

## 2016-06-11 NOTE — Discharge Summary (Signed)
[facility] Discharge Summary   Patient Name:   Deborah Lozano DOB:     Feb 15, 1991 MRN:     [id number]  Date of Admission:   06/09/2016 Date of Discharge:  06/12/2016  Admitting diagnosis:    INDUCTION Principal Problem:   Vaginal delivery Active Problems:   Maternal varicella, non-immune   Positive GBS test   Obesity   Carpal tunnel syndrome during pregnancy  Additional Diagnoses:  None    Discharge diagnoses:    INDUCTION Principal Problem:   Vaginal delivery Active Problems:   Maternal varicella, non-immune   Positive GBS test   Obesity   Carpal tunnel syndrome during pregnancy  Additional diagnoses: None                                                               Post partum procedures: NA  Type of Delivery:  SVB  Delivering Provider: Nigel Bridgeman   Date of Delivery:  06/10/16  Newborn Data:    Live born female  Birth Weight: 7 lb 3.3 oz (3269 g) APGAR: 9, 9  Baby's Name:   Brycen Baby Feeding:   Breast Disposition:   home with mother  Complications:   None  Hospital course:      Onset of Labor With Vaginal Delivery     25 y.o. yo G1P1001 at [redacted]w[redacted]d was admitted for induction due to postdates on 06/09/2016. Patient had an uncomplicated labor course as follows:  Membrane Rupture Time/Date: 11:44 AM ,06/09/2016   Intrapartum Procedures: Episiotomy: None [1]                                         Lacerations:  Periurethral [8]  Patient had a delivery of a Viable infant. 06/10/2016  Information for the patient's newborn:  Daizha, Anand [[id number]]       Pateint had an uncomplicated postpartum course.  She is ambulating, tolerating a regular diet, passing flatus, and urinating well. Patient is discharged home in stable condition on 06/12/16.    Physical Exam:   Vitals:   06/10/16 1738 06/11/16 0625 06/11/16 2345 06/12/16 0523  BP: (!) 122/52 131/74  136/66  Pulse: 82 82  88  Resp: Temp: 98.4 F (36.9 C) 98 F (36.7 C)   98.7 F (37.1 C)  TempSrc: Oral  Axillary Axillary  Weight:      Height:       General: alert Lochia: appropriate Uterine Fundus: firm Incision: Healing well with no significant drainage DVT Evaluation:   Labs:@ CBC Latest Ref Rng & Units 06/11/2016 06/09/2016  WBC 4.0 - 10.5 K/uL 9.6 9.6  Hemoglobin 12.0 - 15.0 g/dL 10.8(L) 13.0  Hematocrit 36.0 - 46.0 % 32.4(L) 37.8  Platelets 150 - 400 K/uL 131(L) 163     Discharge instruction: per After Visit Summary and "Baby and Me Booklet".  After Visit Meds:    Medication List    TAKE these medications   ibuprofen 600 MG tablet Commonly known as:  ADVIL,MOTRIN Take 1 tablet (600 mg total) by mouth every 6 (six) hours.   norethindrone 0.35 MG tablet Commonly known as:  ORTHO MICRONOR Take 1  tablet (0.35 mg total) by mouth daily. Start taking on:  07/02/2016   prenatal multivitamin Tabs tablet Take 1 tablet by mouth daily at 12 noon.       Diet: routine diet  Activity: Advance as tolerated. Pelvic rest for 6 weeks.   Outpatient follow up:6 weeks Follow up Appt: Future Appointments Date Time Provider Department Center  06/16/2016 9:00 AM [facility]AC CONSULTANT [facility] None   Follow up visit: No Follow-up on file.  Postpartum contraception: Progesterone only pills  06/12/2016 Nigel BridgemanLATHAM, Adelheid Hoggard, CNM

## 2016-06-11 NOTE — Progress Notes (Signed)
Postpartum day #1, NSVD  Subjective Pt without complaints.  No F/C/CP/SOB.  Tolerating gen diet.  +flatus, no BM.  Ambulating and voiding without difficulty.  Lochia normal.  Pain controlled.  Breast feeding yes  Temp:  [98 F (36.7 C)-98.8 F (37.1 C)] 98 F (36.7 C) (08/06 0625) Pulse Rate:  [82-100] 82 (08/06 0625) Resp:  [18-20] 18 (08/06 0625) BP: (122-136)/(52-74) 131/74 (08/06 [id number])  Gen:  NAD, A&O x 3 CV: RRR Lungs; CTAB Uterine fundus:  Firm, nontender, below umbilicus Lochia normal Ext:  1+Edema, no calf tenderness bilaterally  CBC    Component Value Date/Time   WBC 9.6 06/11/2016 0515   RBC 3.67 (L) 06/11/2016 0515   HGB 10.8 (L) 06/11/2016 0515   HCT 32.4 (L) 06/11/2016 0515   PLT 131 (L) 06/11/2016 0515   MCV 88.3 06/11/2016 0515   MCH 29.4 06/11/2016 0515   MCHC 33.3 06/11/2016 0515   RDW 15.0 06/11/2016 0515     A/P: S/p SVD doing well. Routine postpartum care. Lactation support. Desires outpatient circumcision.  DISPO: Continue routine postpartum care, plan for discharge home tomorrow.  Deborah HidalgoJennifer Julian Medina, DO [phone] (pager) [phone] (office)

## 2016-06-11 NOTE — Lactation Note (Signed)
This note was copied from a baby's chart. Lactation Consultation Note Follow up consult with this first time mom and term baby, now 337 hours old. Baby has an anterior thin frenulum, that restricts his lip extension. Aggie HackerBryson has been breast feeding with 16 nipple shied. Visible swallows are seen, but no colostrum seen in shield. Mom encouraged to pump at least every 3 hours, in maintenance setting, and supplement baby with EBM. Mom was able to express 12 ml's, which Bryson fed by bottle and tolerated well. I showed mom how to clean and reassemble her pump parts, and we made a hands free bra out of a sports bra.Mom has nipple cracks around the nipple beginning, and comfort gels given. Mom to use coconut oil on her nipples with pumping. [facility], and [facility]. Mom has paper work to fill out, if needed. Mom also has an o/p lactation appointment for Friday, at 9 am. Mom herself has an anterior frenulum, that has now stretched. I gave mom some resource information to look up on how tight frenulums  can effect breast feeding. Mom very receptive to teaching.   Patient Name: Boy Lilla ShookKhadijah Vardaman ZOXWR'UToday's Date: 06/11/2016 Reason for consult: Follow-up assessment   Maternal Data    Feeding Feeding Type: Breast Fed Length of feed: 15 min  LATCH Score/Interventions Latch: Grasps breast easily, tongue down, lips flanged, rhythmical sucking. Intervention(s): Adjust position;Assist with latch  Audible Swallowing: Spontaneous and intermittent (visual) Intervention(s): Skin to skin;Hand expression  Type of Nipple: Flat  Comfort (Breast/Nipple): Filling, red/small blisters or bruises, mild/mod discomfort  Problem noted: Filling;Mild/Moderate discomfort;Cracked, bleeding, blisters, bruises Interventions (Mild/moderate discomfort): Comfort gels Interventions (Severe discomfort): Double electric pum  Hold (Positioning): Assistance needed to correctly  position infant at breast and maintain latch.  LATCH Score: 7  Lactation Tools Discussed/Used Tools: Nipple Shields Nipple shield size: 16   Consult Status Consult Status: Follow-up Date: 06/12/16 Follow-up type: In-patient    Alfred LevinsLee, Marshell Dilauro Anne 06/11/2016, 4:17 PM

## 2016-06-11 NOTE — Progress Notes (Signed)
Spoke with Dr Charlotta Newtonzan about pt requesting discharge today. She will review chart. Sherald BargeMatthews, Rich Paprocki L

## 2016-06-11 NOTE — Discharge Instructions (Signed)

## 2016-06-12 MED ORDER — NORETHINDRONE 0.35 MG PO TABS: 1.0000 | ORAL_TABLET | Freq: Every day | ORAL | 11 refills | Status: DC

## 2016-06-12 NOTE — Lactation Note (Signed)
This note was copied from a baby's chart. Lactation Consultation Note  Assisted mother with obtaining breast pump. Reviewed volume amounts. OP follow-up scheduled. Patient Name: Deborah Lozano Reason for consult: Follow-up assessment   Maternal Data    Feeding    [facility] Score/Interventions                      Lactation Tools Discussed/Used     Consult Status      Deborah Lozano, Deborah Lozano Lozano, 10:25 AM

## 2016-06-16 ENCOUNTER — Ambulatory Visit (HOSPITAL_COMMUNITY)
Admit: 2016-06-16 | Discharge: 2016-06-16 | Disposition: A | Discharge status: Home / Self Care | Payer: Medicaid Other | Attending: Obstetrics and Gynecology | Admitting: Obstetrics and Gynecology

## 2016-06-16 NOTE — Lactation Note (Signed)
Lactation Consult  Mother's reason for visit: Mtoher has had a difficult latch since infant was born. She is exclusively bottle feeding now. She is here today to get help with latching infant. She was using a nipple shield while in the hospital.  Visit Type: feeding assessment   Appointment Notes:Infant is being exclusively bottle fed. He will take 2-3 ounces from the bottle every 2-3 hours.    Consult:  Initial Lactation Consultant:  Michel BickersKendrick, Ajamu Maxon McCoy  ________________________________________________________________________    ________________________________________________________________________  Mother's Name: Deborah Lozano Type of delivery:  vaginal del Breastfeeding Experience:  none Maternal Medical Conditions:  none Maternal Medications: Prenatal vit.    ________________________________________________________________________  Breastfeeding History (Post Discharge)  Frequency of breastfeeding: mother states that she attempt to latch infant yesterday once.   Duration of feeding:   Supplementation    Breastmilk: 180 ml every 2-3 hours   Method:  Bottle  Pumping  Type of pump:  Symphony Frequency:  Every 2-3 hours  Volume: 180 ml every 2-3 hours ,    Infant Intake and Output Assessment  Voids:6-8  in 24 hrs.  Color:  Clear yellow Stools: 4 in 24 hrs.  Color:  Yellow  ________________________________________________________________________  Maternal Breast Assessment  Breast:  Full Nipple:  Erect Pain level:  0 Pain interventions:  Bra  _______________________________________________________________________ Feeding Assessment/Evaluation  Initial feeding assessment:Mother placed the #24 nipple shield on the (R) breast. Infant latched on with out any difficulty. LC adjust lower jaw for wider gape. Infant was observed with good burst of suckles.  Infant's oral assessment:  Variance  Positioning:  Football Right breast  LATCH  documentation:  Latch:  2 = Grasps breast easily, tongue down, lips flanged, rhythmical sucking.  Audible swallowing:  2 = Spontaneous and intermittent  Type of nipple:  2 = Everted at rest and after stimulation  Comfort (Breast/Nipple):  1 = Filling, red/small blisters or bruises, mild/mod discomfort  Hold (Positioning):  1 = Assistance needed to correctly position infant at breast and maintain latch  LATCH score:  8  Attached assessment:  Deep  Lips flanged:  Yes.    Lips untucked:  Yes.    Suck assessment:  Displays both  Tools:  Nipple shield 24 mm Instructed on use and cleaning of tool:  Yes.    Pre-feed weight:  3264 g Post-feed weight: 3306  g  Amount transferred: 42 ml     Total amount transferred:  42 ml Advised mother to continue to try and latch infant with every feeding. At least every 2-3 hours Suggested to use the #24 nipple shield.  Mother to continue to post pump after each feeding.  Mother advised to follow up with Peds and [facility] as needed

## 2016-07-28 DIAGNOSIS — R87612 Low grade squamous intraepithelial lesion on cytologic smear of cervix (LGSIL): Secondary | ICD-10-CM | POA: Insufficient documentation

## 2017-07-08 ENCOUNTER — Encounter (HOSPITAL_COMMUNITY): Payer: Self-pay | Admitting: *Deleted

## 2017-07-08 ENCOUNTER — Inpatient Hospital Stay (HOSPITAL_COMMUNITY)
Admission: AD | Admit: 2017-07-08 | Discharge: 2017-07-08 | Disposition: A | Discharge status: Home / Self Care | Payer: Medicaid Other | Source: Ambulatory Visit | Attending: Obstetrics & Gynecology | Admitting: Obstetrics & Gynecology

## 2017-07-08 ENCOUNTER — Inpatient Hospital Stay (HOSPITAL_COMMUNITY): Payer: Medicaid Other

## 2017-07-08 DIAGNOSIS — O200 Threatened abortion: Secondary | ICD-10-CM | POA: Diagnosis not present

## 2017-07-08 DIAGNOSIS — G5600 Carpal tunnel syndrome, unspecified upper limb: Secondary | ICD-10-CM | POA: Insufficient documentation

## 2017-07-08 DIAGNOSIS — Z79899 Other long term (current) drug therapy: Secondary | ICD-10-CM | POA: Insufficient documentation

## 2017-07-08 DIAGNOSIS — Z3A00 Weeks of gestation of pregnancy not specified: Secondary | ICD-10-CM | POA: Insufficient documentation

## 2017-07-08 DIAGNOSIS — R102 Pelvic and perineal pain: Secondary | ICD-10-CM

## 2017-07-08 DIAGNOSIS — Z87891 Personal history of nicotine dependence: Secondary | ICD-10-CM | POA: Insufficient documentation

## 2017-07-08 DIAGNOSIS — O209 Hemorrhage in early pregnancy, unspecified: Secondary | ICD-10-CM

## 2017-07-08 DIAGNOSIS — O26891 Other specified pregnancy related conditions, first trimester: Secondary | ICD-10-CM

## 2017-07-08 DIAGNOSIS — O99352 Diseases of the nervous system complicating pregnancy, second trimester: Secondary | ICD-10-CM | POA: Diagnosis not present

## 2017-07-08 LAB — URINALYSIS, ROUTINE W REFLEX MICROSCOPIC
Bilirubin Urine: NEGATIVE
GLUCOSE, UA: NEGATIVE mg/dL
KETONES UR: NEGATIVE mg/dL
Leukocytes, UA: NEGATIVE
NITRITE: NEGATIVE
Protein, ur: 30 mg/dL — AB
Specific Gravity, Urine: 1.02 (ref 1.005–1.030)
pH: 6 (ref 5.0–8.0)

## 2017-07-08 LAB — POCT PREGNANCY, URINE: Preg Test, Ur: POSITIVE — AB

## 2017-07-08 LAB — CBC
HCT: 39.5 % (ref 36.0–46.0)
Hemoglobin: 13.5 g/dL (ref 12.0–15.0)
MCH: 30.8 pg (ref 26.0–34.0)
MCHC: 34.2 g/dL (ref 30.0–36.0)
MCV: 90.2 fL (ref 78.0–100.0)
PLATELETS: 222 10*3/uL (ref 150–400)
RBC: 4.38 MIL/uL (ref 3.87–5.11)
RDW: 14.1 % (ref 11.5–15.5)
WBC: 8.1 10*3/uL (ref 4.0–10.5)

## 2017-07-08 LAB — HCG, QUANTITATIVE, PREGNANCY: HCG, BETA CHAIN, QUANT, S: 184 m[IU]/mL — AB (ref <5)

## 2017-07-08 NOTE — MAU Provider Note (Signed)
History     CSN: [id number]  Arrival date and time: 07/08/17 [id number]   First Provider Initiated Contact with Patient 07/08/17 [id number]      Chief Complaint  Patient presents with  . Pelvic Pain  . Vaginal Bleeding   Pelvic Pain  The patient's primary symptoms include pelvic pain. The patient's pertinent negatives include no vaginal discharge. This is a new problem. The current episode started today. The problem occurs constantly. The problem has been unchanged. Pain severity now: 5/10  The problem affects the right side. She is pregnant. Associated symptoms include diarrhea. Pertinent negatives include no chills, dysuria, fever, frequency, nausea, urgency or vomiting. She has been passing clots (multiple clots about the size of a quarter. ). She has not been passing tissue. Nothing aggravates the symptoms. She has tried nothing for the symptoms. Her menstrual history has been regular (LMP 06/02/17 ).   Past Medical History:  Diagnosis Date  . Carpal tunnel syndrome during pregnancy     Past Surgical History:  Procedure Laterality Date  . NO PAST SURGERIES      Family History  Problem Relation Age of Onset  . Cancer Paternal Aunt   . Diabetes Paternal Aunt   . Diabetes Maternal Grandfather   . Diabetes Paternal Grandmother   . Alcohol abuse Neg Hx   . Arthritis Neg Hx   . Asthma Neg Hx   . Birth defects Neg Hx   . COPD Neg Hx   . Depression Neg Hx   . Drug abuse Neg Hx   . Early death Neg Hx   . Hearing loss Neg Hx   . Heart disease Neg Hx   . Hyperlipidemia Neg Hx   . Hypertension Neg Hx   . Kidney disease Neg Hx   . Learning disabilities Neg Hx   . Mental illness Neg Hx   . Mental retardation Neg Hx   . Miscarriages / Stillbirths Neg Hx   . Stroke Neg Hx   . Vision loss Neg Hx   . Varicose Veins Neg Hx     Social History  Substance Use Topics  . Smoking status: Former Smoker    Quit date: 06/07/2015  . Smokeless tobacco: Never Used  . Alcohol use No    Allergies:  No Known Allergies  Prescriptions Prior to Admission  Medication Sig Dispense Refill Last Dose  . ibuprofen (ADVIL,MOTRIN) 600 MG tablet Take 1 tablet (600 mg total) by mouth every 6 (six) hours. 30 tablet 0   . norethindrone (ORTHO MICRONOR) 0.35 MG tablet Take 1 tablet (0.35 mg total) by mouth daily. 1 Package 11   . Prenatal Vit-Fe Fumarate-FA (PRENATAL MULTIVITAMIN) TABS tablet Take 1 tablet by mouth daily at 12 noon.   06/08/2016 at Unknown time    Review of Systems  Constitutional: Negative for chills and fever.  Gastrointestinal: Positive for diarrhea. Negative for nausea and vomiting.  Genitourinary: Positive for pelvic pain and vaginal bleeding. Negative for dysuria, frequency, urgency and vaginal discharge.   Physical Exam   Blood pressure 126/68, pulse 71, temperature 98.6 F (37 C), temperature source Oral, resp. rate 18, height 5\' 5"  (1.651 m), weight 173 lb (78.5 kg), last menstrual period 06/02/2017, unknown if currently breastfeeding.  Physical Exam  Nursing note and vitals reviewed. Constitutional: She is oriented to person, place, and time. She appears well-developed and well-nourished. No distress.  HENT:  Head: Normocephalic.  Cardiovascular: Normal rate.   Respiratory: Effort normal.  GI: Soft. There is no tenderness.  There is no rebound.  Neurological: She is alert and oriented to person, place, and time.  Skin: Skin is warm and dry.  Psychiatric: She has a normal mood and affect.  GU: cervix closed. Uterus normal size & non tender. No adnexal mass or tenderness. Small amount of dark red blood.   MAU Course  Procedures Results for orders placed or performed during the hospital encounter of 07/08/17 (from the past 24 hour(s))  Urinalysis, Routine w reflex microscopic     Status: Abnormal   Collection Time: 07/08/17  7:05 AM  Result Value Ref Range   Color, Urine YELLOW YELLOW   APPearance HAZY (A) CLEAR   Specific Gravity, Urine 1.020 1.005 - 1.030   pH 6.0  5.0 - 8.0   Glucose, UA NEGATIVE NEGATIVE mg/dL   Hgb urine dipstick LARGE (A) NEGATIVE   Bilirubin Urine NEGATIVE NEGATIVE   Ketones, ur NEGATIVE NEGATIVE mg/dL   Protein, ur 30 (A) NEGATIVE mg/dL   Nitrite NEGATIVE NEGATIVE   Leukocytes, UA NEGATIVE NEGATIVE   RBC / HPF TOO NUMEROUS TO COUNT 0 - 5 RBC/hpf   WBC, UA 6-30 0 - 5 WBC/hpf   Bacteria, UA RARE (A) NONE SEEN   Squamous Epithelial / LPF 6-30 (A) NONE SEEN   Mucus PRESENT   Pregnancy, urine POC     Status: Abnormal   Collection Time: 07/08/17  7:30 AM  Result Value Ref Range   Preg Test, Ur POSITIVE (A) NEGATIVE  CBC     Status: None   Collection Time: 07/08/17  7:59 AM  Result Value Ref Range   WBC 8.1 4.0 - 10.5 K/uL   RBC 4.38 3.87 - 5.11 MIL/uL   Hemoglobin 13.5 12.0 - 15.0 g/dL   HCT 16.1 09.6 - 04.5 %   MCV 90.2 78.0 - 100.0 fL   MCH 30.8 26.0 - 34.0 pg   MCHC 34.2 30.0 - 36.0 g/dL   RDW 40.9 81.1 - 91.4 %   Platelets 222 150 - 400 K/uL  hCG, quantitative, pregnancy     Status: Abnormal   Collection Time: 07/08/17  7:59 AM  Result Value Ref Range   hCG, Beta Chain, Quant, S 184 (H) <5 mIU/mL   US Ob Comp Less 14 Wks  Result Date: 07/08/2017 CLINICAL DATA:  Vaginal bleeding and pelvic cramping. Five weeks and 1 day pregnant by last menstrual period. Quantitative beta HCG 184. EXAM: OBSTETRIC <14 WK [place] AND TRANSVAGINAL OB US TECHNIQUE: Both transabdominal and transvaginal ultrasound examinations were performed for complete evaluation of the gestation as well as the maternal uterus, adnexal regions, and pelvic cul-de-sac. Transvaginal technique was performed to assess early pregnancy. COMPARISON:  None. FINDINGS: Intrauterine gestational sac: Visualized Yolk sac:  Visualized Embryo:  Not visualized MSD: 4.6 mm  mm   5 w   1  d Subchorionic hemorrhage:  None visualized. Maternal uterus/adnexae: Normal appearing maternal ovaries. Small amount of free peritoneal fluid. IMPRESSION: Intrauterine gestational sac containing a  yolk sac with no visible fetal pole at this time. The gestational sac is mildly elongated and mildly irregular in shape and the endometrial contents demonstrated to and fro motion at real-time. This could be an indication of impending abortion. However, a normal, viable intrauterine pregnancy is possible. Recommend follow-up quantitative B-HCG levels and follow-up US in 14 days to assess viability. This recommendation follows SRU consensus guidelines: Diagnostic Criteria for Nonviable Pregnancy Early in the First Trimester. Malva Limes Med 2013; 782:9562-13. Electronically Signed   By:  Beckie Salts M.D.   On: 07/08/2017 09:10   US Ob Transvaginal  Result Date: 07/08/2017 CLINICAL DATA:  Vaginal bleeding and pelvic cramping. Five weeks and 1 day pregnant by last menstrual period. Quantitative beta HCG 184. EXAM: OBSTETRIC <14 WK [place] AND TRANSVAGINAL OB US TECHNIQUE: Both transabdominal and transvaginal ultrasound examinations were performed for complete evaluation of the gestation as well as the maternal uterus, adnexal regions, and pelvic cul-de-sac. Transvaginal technique was performed to assess early pregnancy. COMPARISON:  None. FINDINGS: Intrauterine gestational sac: Visualized Yolk sac:  Visualized Embryo:  Not visualized MSD: 4.6 mm  mm   5 w   1  d Subchorionic hemorrhage:  None visualized. Maternal uterus/adnexae: Normal appearing maternal ovaries. Small amount of free peritoneal fluid. IMPRESSION: Intrauterine gestational sac containing a yolk sac with no visible fetal pole at this time. The gestational sac is mildly elongated and mildly irregular in shape and the endometrial contents demonstrated to and fro motion at real-time. This could be an indication of impending abortion. However, a normal, viable intrauterine pregnancy is possible. Recommend follow-up quantitative B-HCG levels and follow-up US in 14 days to assess viability. This recommendation follows SRU consensus guidelines: Diagnostic Criteria  for Nonviable Pregnancy Early in the First Trimester. Malva Limes Med 2013; 096:0454-09. Electronically Signed   By: Beckie Salts M.D.   On: 07/08/2017 09:10    MDM 0800 Care turned over to Judeth Horn NP Thressa Sheller 8:06 AM 07/08/17   O positive Ultrasound shows SIUP with yolk sac, mild elongation & irregular shape. Discussed results with patient. Confirmed IUP today but can't exclude possibility of SAB. Threatened miscarriage precautions given. Will order f/u ultrasound in 2 weeks to assess viability  Assessment and Plan  A: 1. Threatened miscarriage in early pregnancy   2. Pelvic pain in pregnancy, antepartum, first trimester   3. Vaginal bleeding in pregnancy, first trimester    P: Discharge home F/u outpatient ultrasound in 2 weeks Pelvic rest Discussed reasons to return to MAU  Judeth Horn, NP

## 2017-07-08 NOTE — Discharge Instructions (Signed)
Threatened Miscarriage A threatened miscarriage occurs when you have vaginal bleeding during your first 20 weeks of pregnancy but the pregnancy has not ended. If you have vaginal bleeding during this time, your health care provider will do tests to make sure you are still pregnant. If the tests show you are still pregnant and the developing baby (fetus) inside your womb (uterus) is still growing, your condition is considered a threatened miscarriage. A threatened miscarriage does not mean your pregnancy will end, but it does increase the risk of losing your pregnancy (complete miscarriage). What are the causes? The cause of a threatened miscarriage is usually not known. If you go on to have a complete miscarriage, the most common cause is an abnormal number of chromosomes in the developing baby. Chromosomes are the structures inside cells that hold all your genetic material. Some causes of vaginal bleeding that do not result in miscarriage include:  Having sex.  Having an infection.  Normal hormone changes of pregnancy.  Bleeding that occurs when an egg implants in your uterus.  What increases the risk? Risk factors for bleeding in early pregnancy include:  Obesity.  Smoking.  Drinking excessive amounts of alcohol or caffeine.  Recreational drug use.  What are the signs or symptoms?  Light vaginal bleeding.  Mild abdominal pain or cramps. How is this diagnosed? If you have bleeding with or without abdominal pain before 20 weeks of pregnancy, your health care provider will do tests to check whether you are still pregnant. One important test involves using sound waves and a computer (ultrasound) to create images of the inside of your uterus. Other tests include an internal exam of your vagina and uterus (pelvic exam) and measurement of your babys heart rate. You may be diagnosed with a threatened miscarriage if:  Ultrasound testing shows you are still pregnant.  Your babys heart  rate is strong.  A pelvic exam shows that the opening between your uterus and your vagina (cervix) is closed.  Your heart rate and blood pressure are stable.  Blood tests confirm you are still pregnant.  How is this treated? No treatments have been shown to prevent a threatened miscarriage from going on to a complete miscarriage. However, the right home care is important. Follow these instructions at home:  Make sure you keep all your appointments for prenatal care. This is very important.  Get plenty of rest.  Do not have sex or use tampons if you have vaginal bleeding.  Do not douche.  Do not smoke or use recreational drugs.  Do not drink alcohol.  Avoid caffeine. Contact a health care provider if:  You have light vaginal bleeding or spotting while pregnant.  You have abdominal pain or cramping.  You have a fever. Get help right away if:  You have heavy vaginal bleeding.  You have blood clots coming from your vagina.  You have severe low back pain or abdominal cramps.  You have fever, chills, and severe abdominal pain. This information is not intended to replace advice given to you by your health care provider. Make sure you discuss any questions you have with your health care provider. Document Released: 10/23/2005 Document Revised: 03/30/2016 Document Reviewed: 08/19/2013 Elsevier Interactive Patient Education  2018 Elsevier Inc.     Pelvic Rest Pelvic rest may be recommended if:  Your placenta is partially or completely covering the opening of your cervix (placenta previa).  There is bleeding between the wall of the uterus and the amniotic sac in the  first trimester of pregnancy (subchorionic hemorrhage).  You went into labor too early (preterm labor).  Based on your overall health and the health of your baby, your health care provider will decide if pelvic rest is right for you. How do I rest my pelvis? For as long as told by your health care  provider:  Do not have sex, sexual stimulation, or an orgasm.  Do not use tampons. Do not douche. Do not put anything in your vagina.  Do not lift anything that is heavier than 10 lb (4.5 kg).  Avoid activities that take a lot of effort (are strenuous).  Avoid any activity in which your pelvic muscles could become strained.  When should I seek medical care? Seek medical care if you have:  Cramping pain in your lower abdomen.  Vaginal discharge.  A low, dull backache.  Regular contractions.  Uterine tightening.  When should I seek immediate medical care? Seek immediate medical care if:  You have vaginal bleeding and you are pregnant.  This information is not intended to replace advice given to you by your health care provider. Make sure you discuss any questions you have with your health care provider. Document Released: 02/17/2011 Document Revised: 03/30/2016 Document Reviewed: 04/26/2015 Elsevier Interactive Patient Education  [employer].

## 2017-07-08 NOTE — MAU Note (Signed)
Pt started cramping yesterday, this morning saw blood & ? Tissue in the toilet.  Pos UPT @ [facility] last Monday.

## 2017-07-10 ENCOUNTER — Ambulatory Visit (HOSPITAL_COMMUNITY): Payer: Self-pay

## 2017-07-11 ENCOUNTER — Ambulatory Visit (INDEPENDENT_AMBULATORY_CARE_PROVIDER_SITE_OTHER): Payer: Self-pay | Admitting: General Practice

## 2017-07-11 DIAGNOSIS — O200 Threatened abortion: Secondary | ICD-10-CM

## 2017-07-11 LAB — HCG, QUANTITATIVE, PREGNANCY: hCG, Beta Chain, Quant, S: 13 m[IU]/mL — ABNORMAL HIGH (ref <5)

## 2017-07-11 NOTE — Progress Notes (Signed)
I called patient with results, since I had spoken with her about this visit yesterday. Quant hCG has dropped significantly at this time indicated SAB. Advised patient of diagnosis and discussed expected bleeding pattern and pain management. Advised of warning symptoms that would indicate a need to come to MAU for evaluation. Patient will be scheduled in 1-2 weeks to see a provider in the office for follow-up. Patient was emotional about SAB and did not have any other questions, but was advised that she could call the office at [phone] and choose the option to leave a message for the nurse if she had any questions at a later date. Patient verbalized understanding.   Marny LowensteinWenzel, Tyronda Vizcarrondo N, PA-C 07/11/2017 2:08 PM

## 2017-07-11 NOTE — Progress Notes (Signed)
Patient here for stat bhcg today per Raynelle FanningJulie but does not need to wait for results. Patient reports continued heavy bleeding since Sunday and requests call back at [phone]. Reviewed patient's labs with Vonzella NippleJulie Wenzel who will call patient with results.

## 2017-07-12 ENCOUNTER — Telehealth: Payer: Self-pay | Admitting: General Practice

## 2017-07-12 NOTE — Telephone Encounter (Signed)
Called and notified patient of appointment on 07/30/17 at 6:00pm for SAB follow up.  Patient voiced understanding.

## 2017-07-13 ENCOUNTER — Telehealth: Payer: Self-pay | Admitting: *Deleted

## 2017-07-13 NOTE — Telephone Encounter (Signed)
I called patient back and left a message I am returning her call, please call us back before 12 today or Monday after 8am as we close for weekend. If you have an urgent need to be seen, go to [facility].

## 2017-07-13 NOTE — Telephone Encounter (Signed)
Patient called 07/12/17 and left a message she would a call about special instructions as far as home care.

## 2017-07-19 NOTE — Telephone Encounter (Signed)
LM that this is our second attempt in trying to reach you if you have any further questions to please give us a call back.  Also LM stating pt's appt scheduled 07/30/17 @ 1800.

## 2017-07-24 ENCOUNTER — Ambulatory Visit (HOSPITAL_COMMUNITY): Payer: Self-pay

## 2017-07-30 ENCOUNTER — Encounter: Payer: Self-pay | Admitting: General Practice

## 2017-07-30 ENCOUNTER — Ambulatory Visit: Payer: Self-pay | Admitting: Medical

## 2017-07-30 NOTE — Progress Notes (Unsigned)
Patient no showed for appt. Per Vonzella Nipple, patient can reschedule on her own accord

## 2017-11-06 NOTE — L&D Delivery Note (Signed)
Delivery Note At 7:53 AM a viable female was delivered via Vaginal, Spontaneous (Presentation: direct OA).  APGAR: 9, 9; weight pending.   Placenta status: delivered spontaneously and completely.  Cord: three vessel with the following complications: nuchal x1.   Anesthesia:  Epidural  Episiotomy: None Lacerations: Superficial hemostatic left labial Suture Repair: n/a Est. Blood Loss (mL): 50  Mom to postpartum.  Baby to Couplet care / Skin to Skin.  Janeece RiggersEllis K Bandy Honaker 09/19/2018, 8:22 AM

## 2017-11-21 DIAGNOSIS — Z6828 Body mass index (BMI) 28.0-28.9, adult: Secondary | ICD-10-CM | POA: Diagnosis not present

## 2017-11-21 DIAGNOSIS — Z124 Encounter for screening for malignant neoplasm of cervix: Secondary | ICD-10-CM | POA: Diagnosis not present

## 2017-11-21 DIAGNOSIS — Z01419 Encounter for gynecological examination (general) (routine) without abnormal findings: Secondary | ICD-10-CM | POA: Diagnosis not present

## 2017-11-21 DIAGNOSIS — R102 Pelvic and perineal pain: Secondary | ICD-10-CM | POA: Diagnosis not present

## 2017-11-21 DIAGNOSIS — Z3040 Encounter for surveillance of contraceptives, unspecified: Secondary | ICD-10-CM | POA: Diagnosis not present

## 2017-11-21 DIAGNOSIS — Z113 Encounter for screening for infections with a predominantly sexual mode of transmission: Secondary | ICD-10-CM | POA: Diagnosis not present

## 2017-11-21 DIAGNOSIS — R399 Unspecified symptoms and signs involving the genitourinary system: Secondary | ICD-10-CM | POA: Diagnosis not present

## 2017-12-08 ENCOUNTER — Other Ambulatory Visit: Payer: Self-pay

## 2017-12-08 ENCOUNTER — Ambulatory Visit (INDEPENDENT_AMBULATORY_CARE_PROVIDER_SITE_OTHER): Payer: 59 | Admitting: Family Medicine

## 2017-12-08 ENCOUNTER — Encounter: Payer: Self-pay | Admitting: Family Medicine

## 2017-12-08 VITALS — BP 108/62 | HR 73 | Temp 98.2°F | Resp 18 | Ht 65.0 in | Wt 167.0 lb

## 2017-12-08 DIAGNOSIS — R350 Frequency of micturition: Secondary | ICD-10-CM

## 2017-12-08 DIAGNOSIS — M545 Low back pain, unspecified: Secondary | ICD-10-CM

## 2017-12-08 DIAGNOSIS — M7062 Trochanteric bursitis, left hip: Secondary | ICD-10-CM | POA: Diagnosis not present

## 2017-12-08 LAB — POCT URINALYSIS DIP (MANUAL ENTRY)
Bilirubin, UA: NEGATIVE
Glucose, UA: NEGATIVE mg/dL
Ketones, POC UA: NEGATIVE mg/dL
Leukocytes, UA: NEGATIVE
Nitrite, UA: NEGATIVE
Protein Ur, POC: NEGATIVE mg/dL
Spec Grav, UA: 1.015 (ref 1.010–1.025)
Urobilinogen, UA: 0.2 E.U./dL
pH, UA: 7 (ref 5.0–8.0)

## 2017-12-08 LAB — POC MICROSCOPIC URINALYSIS (UMFC): Mucus: ABSENT

## 2017-12-08 MED ORDER — IBUPROFEN 800 MG PO TABS: 800.0000 mg | ORAL_TABLET | Freq: Three times a day (TID) | ORAL | 0 refills | Status: DC | PRN

## 2017-12-08 NOTE — Progress Notes (Signed)
2/2/201910:28 AM  Deborah Lozano [birth year], 27 y.o. female [id number]  Chief Complaint  Patient presents with  . Back Pain  . Urinary Frequency    HPI:   Patient is a 27 y.o. female who presents today with two concerns:  1. Left sided low back/hip pain that started after she was moving some furniture at work about 3-4 days ago. Did epson salt baths and topical ointments, low back better but hip still bothering her specially to sleep, she tends to sleep on her left side.   2. Urinary frequency, treated at [facility] with bactrim for presumed uti, neg bv or trich on pap and neg std testing, urine culture came back neg. Denies any hematuria or dysuria but does have some pressure. Has had increase in caffeine intake. No nausea, vomiting, fever or chills.  Depression screen PHQ 2/9 12/08/2017  Decreased Interest 0  Down, Depressed, Hopeless 0  PHQ - 2 Score 0    No Known Allergies  Prior to Admission medications   Medication Sig Start Date End Date Taking? Authorizing Provider  Prenatal Vit-Fe Fumarate-FA (PRENATAL MULTIVITAMIN) TABS tablet Take 1 tablet by mouth daily at 12 noon.   Yes [provider]  Burr Medico 150-35 MCG/24HR transdermal patch APPLY 1 PATCH EVERY WEEK FOR 3 WEEKS 11/21/17  Yes [provider]    Past Medical History:  Diagnosis Date  . Carpal tunnel syndrome during pregnancy     Past Surgical History:  Procedure Laterality Date  . NO PAST SURGERIES      Social History   Tobacco Use  . Smoking status: Former Smoker    Last attempt to quit: 06/07/2015    Years since quitting: 2.5  . Smokeless tobacco: Never Used  Substance Use Topics  . Alcohol use: No    Family History  Problem Relation Age of Onset  . Cancer Paternal Aunt   . Diabetes Paternal Aunt   . Diabetes Maternal Grandfather   . Diabetes Paternal Grandmother   . Alcohol abuse Neg Hx   . Arthritis Neg Hx   . Asthma Neg Hx   . Birth defects Neg Hx   . COPD Neg Hx   .  Depression Neg Hx   . Drug abuse Neg Hx   . Early death Neg Hx   . Hearing loss Neg Hx   . Heart disease Neg Hx   . Hyperlipidemia Neg Hx   . Hypertension Neg Hx   . Kidney disease Neg Hx   . Learning disabilities Neg Hx   . Mental illness Neg Hx   . Mental retardation Neg Hx   . Miscarriages / Stillbirths Neg Hx   . Stroke Neg Hx   . Vision loss Neg Hx   . Varicose Veins Neg Hx     ROS Per hpi  OBJECTIVE:  Blood pressure 108/62, pulse 73, temperature 98.2 F (36.8 C), temperature source Oral, resp. rate 18, height 5\' 5"  (1.651 m), weight 167 lb (75.8 kg), last menstrual period 11/24/2017, SpO2 99 %, not currently breastfeeding.  Physical Exam  Constitutional: She is oriented to person, place, and time and well-developed, well-nourished, and in no distress.  HENT:  Head: Normocephalic and atraumatic.  Mouth/Throat: Oropharynx is clear and moist. No oropharyngeal exudate.  Eyes: EOM are normal. Pupils are equal, round, and reactive to light. No scleral icterus.  Neck: Neck supple.  Cardiovascular: Normal rate, regular rhythm and normal heart sounds. Exam reveals no gallop and no friction rub.  No murmur  heard. Pulmonary/Chest: Effort normal and breath sounds normal. She has no wheezes. She has no rales.  Abdominal: Soft. Bowel sounds are normal. There is no tenderness. There is no CVA tenderness.  Musculoskeletal: She exhibits no edema.       Left hip: She exhibits tenderness (greater trochanteric bursa). She exhibits normal range of motion.       Lumbar back: Normal. She exhibits normal range of motion, no tenderness, no bony tenderness and no spasm.  Neurological: She is alert and oriented to person, place, and time. Gait normal.  Skin: Skin is warm and dry.     Results for orders placed or performed in visit on 12/08/17 (from the past 24 hour(s))  POCT urinalysis dipstick     Status: Abnormal   Collection Time: 12/08/17 10:37 AM  Result Value Ref Range   Color, UA  yellow yellow   Clarity, UA clear clear   Glucose, UA negative negative mg/dL   Bilirubin, UA negative negative   Ketones, POC UA negative negative mg/dL   Spec Grav, UA 1.9141.015 [id number] - 1.025   Blood, UA small (A) negative   pH, UA 7.0 5.0 - 8.0   Protein Ur, POC negative negative mg/dL   Urobilinogen, UA 0.2 0.2 or 1.0 E.U./dL   Nitrite, UA Negative Negative   Leukocytes, UA Negative Negative  POCT Microscopic Urinalysis ([facility])     Status: Abnormal   Collection Time: 12/08/17 10:49 AM  Result Value Ref Range   WBC,UR,HPF,POC None None WBC/hpf   RBC,UR,HPF,POC Few (A) None RBC/hpf   Bacteria Few (A) None, Too numerous to count   Mucus Absent Absent   Epithelial Cells, UR Per Microscopy Few (A) None, Too numerous to count cells/hpf     ASSESSMENT and PLAN  1. Trochanteric bursitis of left hip Discussed supportive measures, new meds r/se/b and RTC precautions. Patient educational handout given. - ibuprofen (ADVIL,MOTRIN) 800 MG tablet; Take 1 tablet (800 mg total) by mouth every 8 (eight) hours as needed.  2. Acute left-sided low back pain without sciatica  3. Urinary frequency No signs of infection, most likely related to increase in caffeine intake. Advised cutting back, increase water intake, Micro + few RBCs, Not on her period. Asked to return in about a month to repeat urine,  - POCT urinalysis dipstick - POCT Microscopic Urinalysis ([facility])    Return in about 4 weeks (around 01/05/2018), or microscopic hematuria.    Myles LippsIrma M Santiago, MD [facility] [address] [place], [place] [address] Ph.  [phone] Fax [phone]

## 2017-12-08 NOTE — Patient Instructions (Addendum)
IF you received an x-ray today, you will receive an invoice from [facility]. Please contact [facility] at [phone] with questions or concerns regarding your invoice.   IF you received labwork today, you will receive an invoice from [facility]. Please contact [facility] at [phone] with questions or concerns regarding your invoice.   Our billing staff will not be able to assist you with questions regarding bills from these companies.  You will be contacted with the lab results as soon as they are available. The fastest way to get your results is to activate your My Chart account. Instructions are located on the last page of this paperwork. If you have not heard from [place] regarding the results in 2 weeks, please contact this office.     Interstitial Cystitis Interstitial cystitis is a condition that causes inflammation of the bladder. The bladder is a hollow organ in the lower part of your abdomen. It stores urine after the urine is made by your kidneys. With interstitial cystitis, you may have pain in the bladder area. You may also have a frequent and urgent need to urinate. The severity of interstitial cystitis can vary from person to person. You may have flare-ups of the condition, and then it may go away for a while. For many people who have this condition, it becomes a long-term problem. What are the causes? The cause of this condition is not known. What increases the risk? This condition is more likely to develop in women. What are the signs or symptoms? Symptoms of interstitial cystitis vary, and they can change over time. Symptoms may include:  Discomfort or pain in the bladder area. This can range from mild to severe. The pain may change in intensity as the bladder fills with urine or as it empties.  Pelvic pain.  An urgent need to urinate.  Frequent urination.  Pain during sexual intercourse.  Pinpoint bleeding on the bladder wall.  For women, the  symptoms often get worse during menstruation. How is this diagnosed? This condition is diagnosed by evaluating your symptoms and ruling out other causes. A physical exam will be done. Various tests may be done to rule out other conditions. Common tests include:  Urine tests.  Cystoscopy. In this test, a tool that is like a very thin telescope is used to look into your bladder.  Biopsy. This involves taking a sample of tissue from the bladder wall to be examined under a microscope.  How is this treated? There is no cure for interstitial cystitis, but treatment methods are available to control your symptoms. Work closely with your health care provider to find the treatments that will be most effective for you. Treatment options may include:  Medicines to relieve pain and to help reduce the number of times that you feel the need to urinate.  Bladder training. This involves learning ways to control when you urinate, such as: ? Urinating at scheduled times. ? Training yourself to delay urination. ? Doing exercises (Kegel exercises) to strengthen the muscles that control urine flow.  Lifestyle changes, such as changing your diet or taking steps to control stress.  Use of a device that provides electrical stimulation in order to reduce pain.  A procedure that stretches your bladder by filling it with air or fluid.  Surgery. This is rare. It is only done for extreme cases if other treatments do not help.  Follow these instructions at home:  Take medicines only as directed by your health care  provider.  Use bladder training techniques as directed. ? Keep a bladder diary to find out which foods, liquids, or activities make your symptoms worse. ? Use your bladder diary to schedule bathroom trips. If you are away from home, plan to be near a bathroom at each of your scheduled times. ? Make sure you urinate just before you leave the house and just before you go to bed.  Do Kegel exercises as  directed by your health care provider.  Do not drink alcohol.  Do not use any tobacco products, including cigarettes, chewing tobacco, or electronic cigarettes. If you need help quitting, ask your health care provider.  Make dietary changes as directed by your health care provider. You may need to avoid spicy foods and foods that contain a high amount of potassium.  Limit your drinking of beverages that stimulate urination. These include soda, coffee, and tea.  Keep all follow-up visits as directed by your health care provider. This is important. Contact a health care provider if:  Your symptoms do not get better after treatment.  Your pain and discomfort are getting worse.  You have more frequent urges to urinate.  You have a fever. Get help right away if:  You are not able to control your bladder at all. This information is not intended to replace advice given to you by your health care provider. Make sure you discuss any questions you have with your health care provider. Document Released: 06/23/2004 Document Revised: 03/30/2016 Document Reviewed: 06/30/2014 Elsevier Interactive Patient Education  2018 Elsevier Inc.  Hip Pain The hip is the joint between the upper legs and the lower pelvis. The bones, cartilage, tendons, and muscles of your hip joint support your body and allow you to move around. Hip pain can range from a minor ache to severe pain in one or both of your hips. The pain may be felt on the inside of the hip joint near the groin, or the outside near the buttocks and upper thigh. You may also have swelling or stiffness. Follow these instructions at home: Managing pain, stiffness, and swelling  If directed, apply ice to the injured area. ? Put ice in a plastic bag. ? Place a towel between your skin and the bag. ? Leave the ice on for 20 minutes, 2-3 times a day  Sleep with a pillow between your legs on your most comfortable side.  Avoid any activities that  cause pain. General instructions  Take over-the-counter and prescription medicines only as told by your health care provider.  Do any exercises as told by your health care provider.  Record the following: ? How often you have hip pain. ? The location of your pain. ? What the pain feels like. ? What makes the pain worse.  Keep all follow-up visits as told by your health care provider. This is important. Contact a health care provider if:  You cannot put weight on your leg.  Your pain or swelling continues or gets worse after one week.  It gets harder to walk.  You have a fever. Get help right away if:  You fall.  You have a sudden increase in pain and swelling in your hip.  Your hip is red or swollen or very tender to touch. Summary  Hip pain can range from a minor ache to severe pain in one or both of your hips.  The pain may be felt on the inside of the hip joint near the groin, or the outside near  the buttocks and upper thigh.  Avoid any activities that cause pain.  Record how often you have hip pain, the location of the pain, what makes it worse and what it feels like. This information is not intended to replace advice given to you by your health care provider. Make sure you discuss any questions you have with your health care provider. Document Released: 04/12/2010 Document Revised: 09/25/2016 Document Reviewed: 09/25/2016 Elsevier Interactive Patient Education  [employer]2018 [employer].

## 2018-01-25 DIAGNOSIS — Z8759 Personal history of other complications of pregnancy, childbirth and the puerperium: Secondary | ICD-10-CM | POA: Diagnosis not present

## 2018-01-25 DIAGNOSIS — Z3201 Encounter for pregnancy test, result positive: Secondary | ICD-10-CM | POA: Diagnosis not present

## 2018-01-30 DIAGNOSIS — Z3A01 Less than 8 weeks gestation of pregnancy: Secondary | ICD-10-CM | POA: Diagnosis not present

## 2018-01-30 DIAGNOSIS — O09291 Supervision of pregnancy with other poor reproductive or obstetric history, first trimester: Secondary | ICD-10-CM | POA: Diagnosis not present

## 2018-02-06 DIAGNOSIS — Z3201 Encounter for pregnancy test, result positive: Secondary | ICD-10-CM | POA: Diagnosis not present

## 2018-02-06 DIAGNOSIS — R1031 Right lower quadrant pain: Secondary | ICD-10-CM | POA: Diagnosis not present

## 2018-03-08 DIAGNOSIS — Z3689 Encounter for other specified antenatal screening: Secondary | ICD-10-CM | POA: Diagnosis not present

## 2018-03-08 DIAGNOSIS — Z3481 Encounter for supervision of other normal pregnancy, first trimester: Secondary | ICD-10-CM | POA: Diagnosis not present

## 2018-03-08 DIAGNOSIS — Z3480 Encounter for supervision of other normal pregnancy, unspecified trimester: Secondary | ICD-10-CM | POA: Diagnosis not present

## 2018-03-08 LAB — OB RESULTS CONSOLE HEPATITIS B SURFACE ANTIGEN: Hepatitis B Surface Ag: NEGATIVE

## 2018-03-08 LAB — OB RESULTS CONSOLE ABO/RH: RH Type: POSITIVE

## 2018-03-08 LAB — OB RESULTS CONSOLE GC/CHLAMYDIA
CHLAMYDIA, DNA PROBE: NEGATIVE
GC PROBE AMP, GENITAL: NEGATIVE

## 2018-03-08 LAB — OB RESULTS CONSOLE RUBELLA ANTIBODY, IGM: RUBELLA: IMMUNE

## 2018-03-08 LAB — OB RESULTS CONSOLE RPR: RPR: NONREACTIVE

## 2018-03-08 LAB — OB RESULTS CONSOLE ANTIBODY SCREEN: ANTIBODY SCREEN: NEGATIVE

## 2018-03-08 LAB — OB RESULTS CONSOLE HIV ANTIBODY (ROUTINE TESTING): HIV: NONREACTIVE

## 2018-03-13 DIAGNOSIS — O09299 Supervision of pregnancy with other poor reproductive or obstetric history, unspecified trimester: Secondary | ICD-10-CM | POA: Diagnosis not present

## 2018-03-13 DIAGNOSIS — Z3A11 11 weeks gestation of pregnancy: Secondary | ICD-10-CM | POA: Diagnosis not present

## 2018-04-04 ENCOUNTER — Encounter (HOSPITAL_COMMUNITY): Payer: Self-pay | Admitting: *Deleted

## 2018-04-04 ENCOUNTER — Inpatient Hospital Stay (HOSPITAL_COMMUNITY)
Admission: AD | Admit: 2018-04-04 | Discharge: 2018-04-04 | Disposition: A | Discharge status: Home / Self Care | Payer: 59 | Source: Ambulatory Visit | Attending: Obstetrics and Gynecology | Admitting: Obstetrics and Gynecology

## 2018-04-04 DIAGNOSIS — Z87891 Personal history of nicotine dependence: Secondary | ICD-10-CM | POA: Insufficient documentation

## 2018-04-04 DIAGNOSIS — Z3A14 14 weeks gestation of pregnancy: Secondary | ICD-10-CM | POA: Insufficient documentation

## 2018-04-04 DIAGNOSIS — R55 Syncope and collapse: Secondary | ICD-10-CM | POA: Diagnosis present

## 2018-04-04 DIAGNOSIS — O99282 Endocrine, nutritional and metabolic diseases complicating pregnancy, second trimester: Secondary | ICD-10-CM | POA: Insufficient documentation

## 2018-04-04 DIAGNOSIS — O26892 Other specified pregnancy related conditions, second trimester: Secondary | ICD-10-CM | POA: Diagnosis not present

## 2018-04-04 DIAGNOSIS — Z331 Pregnant state, incidental: Secondary | ICD-10-CM

## 2018-04-04 DIAGNOSIS — E162 Hypoglycemia, unspecified: Secondary | ICD-10-CM | POA: Diagnosis not present

## 2018-04-04 DIAGNOSIS — E161 Other hypoglycemia: Secondary | ICD-10-CM | POA: Diagnosis not present

## 2018-04-04 LAB — URINALYSIS, ROUTINE W REFLEX MICROSCOPIC
Bilirubin Urine: NEGATIVE
Glucose, UA: NEGATIVE mg/dL
HGB URINE DIPSTICK: NEGATIVE
Ketones, ur: NEGATIVE mg/dL
LEUKOCYTES UA: NEGATIVE
Nitrite: NEGATIVE
PROTEIN: NEGATIVE mg/dL
SPECIFIC GRAVITY, URINE: 1.013 (ref 1.005–1.030)
pH: 7 (ref 5.0–8.0)

## 2018-04-04 LAB — GLUCOSE, CAPILLARY: GLUCOSE-CAPILLARY: 99 mg/dL (ref 65–99)

## 2018-04-04 NOTE — MAU Note (Signed)
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 Still fels hot and diaphoretic

## 2018-04-04 NOTE — Discharge Instructions (Signed)
Frequent small protein intake every 2-3 hours

## 2018-04-04 NOTE — MAU Provider Note (Signed)
History     Chief Complaint  Patient presents with  . 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 Still feels hot and diaphoretic. Pt had a chicken sandwich at 12noon. Prior to that was breakfast which was also something small    OB History    Gravida  3   Para  1   Term  1   Preterm      AB      Living  1     SAB      TAB      Ectopic      Multiple  0   Live Births  1           Past Medical History:  Diagnosis Date  . Carpal tunnel syndrome during pregnancy     Past Surgical History:  Procedure Laterality Date  . NO PAST SURGERIES      Family History  Problem Relation Age of Onset  . Cancer Paternal Aunt   . Diabetes Paternal Aunt   . Diabetes Maternal Grandfather   . Diabetes Paternal Grandmother   . Alcohol abuse Neg Hx   . Arthritis Neg Hx   . Asthma Neg Hx   . Birth defects Neg Hx   . COPD Neg Hx   . Depression Neg Hx   . Drug abuse Neg Hx   . Early death Neg Hx   . Hearing loss Neg Hx   . Heart disease Neg Hx   . Hyperlipidemia Neg Hx   . Hypertension Neg Hx   . Kidney disease Neg Hx   . Learning disabilities Neg Hx   . Mental illness Neg Hx   . Mental retardation Neg Hx   . Miscarriages / Stillbirths Neg Hx   . Stroke Neg Hx   . Vision loss Neg Hx   . Varicose Veins Neg Hx     Social History   Tobacco Use  . Smoking status: Former Smoker    Last attempt to quit: 06/07/2015    Years since quitting: 2.8  . Smokeless tobacco: Never Used  Substance Use Topics  . Alcohol use: No  . Drug use: No    Allergies: No Known Allergies  Medications Prior to Admission  Medication Sig Dispense Refill Last Dose  . Prenatal Vit-Fe Fumarate-FA (PRENATAL MULTIVITAMIN) TABS tablet Take 1 tablet by mouth daily at 12 noon.   04/03/2018 at Unknown time  . promethazine (PHENERGAN) 25 MG tablet Take 25 mg by  mouth every 6 (six) hours as needed for nausea or vomiting.   Past Week at Unknown time     Physical Exam   Blood pressure 124/66, pulse 69, temperature 98.6 F (37 C), temperature source Oral, resp. rate 18, last menstrual period 11/24/2017, not currently breastfeeding.  General appearance: alert, cooperative and no distress Lungs: clear to auscultation bilaterally Cor RRR w/o murmur Abdomen: gravid nontender  Pelvic deferred CBG 99 U/a: neg sp gravity 1.013 ED Course  IMP hypoglycemia IUP@ 14 6/7 weeks P) disc frequent small protein meals. Given letter for work for breaks q 2-3 hrs Increase fluid intake. Keep sched OB appt in am D/c home MDM   Serita Kyle, MD 4:12 PM 04/04/2018

## 2018-04-05 ENCOUNTER — Other Ambulatory Visit: Payer: Self-pay | Admitting: Obstetrics and Gynecology

## 2018-04-05 DIAGNOSIS — Z3A15 15 weeks gestation of pregnancy: Secondary | ICD-10-CM | POA: Diagnosis not present

## 2018-04-05 DIAGNOSIS — O09299 Supervision of pregnancy with other poor reproductive or obstetric history, unspecified trimester: Secondary | ICD-10-CM | POA: Diagnosis not present

## 2018-04-05 DIAGNOSIS — Z361 Encounter for antenatal screening for raised alphafetoprotein level: Secondary | ICD-10-CM | POA: Diagnosis not present

## 2018-04-06 LAB — URINE CULTURE: Culture: <10000 — AB

## 2018-05-03 DIAGNOSIS — Z363 Encounter for antenatal screening for malformations: Secondary | ICD-10-CM | POA: Diagnosis not present

## 2018-05-06 DIAGNOSIS — H52223 Regular astigmatism, bilateral: Secondary | ICD-10-CM | POA: Diagnosis not present

## 2018-05-20 ENCOUNTER — Encounter (HOSPITAL_COMMUNITY): Payer: Self-pay | Admitting: *Deleted

## 2018-05-20 ENCOUNTER — Inpatient Hospital Stay (HOSPITAL_COMMUNITY)
Admission: AD | Admit: 2018-05-20 | Discharge: 2018-05-21 | Disposition: A | Discharge status: Home / Self Care | Payer: 59 | Source: Ambulatory Visit | Attending: Obstetrics and Gynecology | Admitting: Obstetrics and Gynecology

## 2018-05-20 DIAGNOSIS — O212 Late vomiting of pregnancy: Secondary | ICD-10-CM | POA: Insufficient documentation

## 2018-05-20 DIAGNOSIS — R109 Unspecified abdominal pain: Secondary | ICD-10-CM | POA: Insufficient documentation

## 2018-05-20 DIAGNOSIS — O99280 Endocrine, nutritional and metabolic diseases complicating pregnancy, unspecified trimester: Secondary | ICD-10-CM

## 2018-05-20 DIAGNOSIS — E860 Dehydration: Secondary | ICD-10-CM

## 2018-05-20 DIAGNOSIS — O4290 Premature rupture of membranes, unspecified as to length of time between rupture and onset of labor, unspecified weeks of gestation: Secondary | ICD-10-CM

## 2018-05-20 DIAGNOSIS — G5600 Carpal tunnel syndrome, unspecified upper limb: Secondary | ICD-10-CM | POA: Diagnosis not present

## 2018-05-20 DIAGNOSIS — Z87891 Personal history of nicotine dependence: Secondary | ICD-10-CM | POA: Diagnosis not present

## 2018-05-20 DIAGNOSIS — Z809 Family history of malignant neoplasm, unspecified: Secondary | ICD-10-CM | POA: Insufficient documentation

## 2018-05-20 DIAGNOSIS — O99352 Diseases of the nervous system complicating pregnancy, second trimester: Secondary | ICD-10-CM | POA: Diagnosis not present

## 2018-05-20 DIAGNOSIS — R52 Pain, unspecified: Secondary | ICD-10-CM

## 2018-05-20 DIAGNOSIS — Z3A21 21 weeks gestation of pregnancy: Secondary | ICD-10-CM | POA: Insufficient documentation

## 2018-05-20 DIAGNOSIS — O219 Vomiting of pregnancy, unspecified: Secondary | ICD-10-CM

## 2018-05-20 DIAGNOSIS — O4292 Full-term premature rupture of membranes, unspecified as to length of time between rupture and onset of labor: Secondary | ICD-10-CM | POA: Insufficient documentation

## 2018-05-20 DIAGNOSIS — Z833 Family history of diabetes mellitus: Secondary | ICD-10-CM | POA: Insufficient documentation

## 2018-05-20 DIAGNOSIS — Z79899 Other long term (current) drug therapy: Secondary | ICD-10-CM | POA: Insufficient documentation

## 2018-05-20 DIAGNOSIS — O26892 Other specified pregnancy related conditions, second trimester: Secondary | ICD-10-CM | POA: Insufficient documentation

## 2018-05-20 DIAGNOSIS — O26899 Other specified pregnancy related conditions, unspecified trimester: Secondary | ICD-10-CM

## 2018-05-20 NOTE — MAU Note (Signed)
PT SAYS  SHE CALLED OFFICE  TODAY AFTER HRS- TOLD THEM  HAS BEEN VOMITING  SINCE 10 AM.    CRAMPING SINCE 12 NOON.   NOBODY ELSE HAS BEEN SICK.   THERE'S A CHANGE IN VAG D/C- SINCE SAT - UNDERWEAR  VERY WET.   HAS AN APPOINTMENT ON Thursday.   HAD AN U/S  LAST Thursday - TO LOOK AT BABY HEART.

## 2018-05-21 ENCOUNTER — Inpatient Hospital Stay (HOSPITAL_COMMUNITY): Payer: 59

## 2018-05-21 ENCOUNTER — Other Ambulatory Visit: Payer: Self-pay | Admitting: Obstetrics and Gynecology

## 2018-05-21 DIAGNOSIS — O4292 Full-term premature rupture of membranes, unspecified as to length of time between rupture and onset of labor: Secondary | ICD-10-CM | POA: Diagnosis not present

## 2018-05-21 DIAGNOSIS — O99352 Diseases of the nervous system complicating pregnancy, second trimester: Secondary | ICD-10-CM | POA: Diagnosis not present

## 2018-05-21 DIAGNOSIS — Z809 Family history of malignant neoplasm, unspecified: Secondary | ICD-10-CM | POA: Diagnosis not present

## 2018-05-21 DIAGNOSIS — Z3A21 21 weeks gestation of pregnancy: Secondary | ICD-10-CM | POA: Diagnosis not present

## 2018-05-21 DIAGNOSIS — Z833 Family history of diabetes mellitus: Secondary | ICD-10-CM | POA: Diagnosis not present

## 2018-05-21 DIAGNOSIS — G5600 Carpal tunnel syndrome, unspecified upper limb: Secondary | ICD-10-CM | POA: Diagnosis not present

## 2018-05-21 DIAGNOSIS — O26892 Other specified pregnancy related conditions, second trimester: Secondary | ICD-10-CM | POA: Diagnosis not present

## 2018-05-21 DIAGNOSIS — R109 Unspecified abdominal pain: Secondary | ICD-10-CM | POA: Diagnosis not present

## 2018-05-21 DIAGNOSIS — O212 Late vomiting of pregnancy: Secondary | ICD-10-CM | POA: Diagnosis not present

## 2018-05-21 LAB — URINALYSIS, ROUTINE W REFLEX MICROSCOPIC
Bilirubin Urine: NEGATIVE
GLUCOSE, UA: NEGATIVE mg/dL
KETONES UR: 20 mg/dL — AB
Nitrite: NEGATIVE
PH: 5 (ref 5.0–8.0)
PROTEIN: 100 mg/dL — AB
Specific Gravity, Urine: 1.033 — ABNORMAL HIGH (ref 1.005–1.030)

## 2018-05-21 LAB — WET PREP, GENITAL
CLUE CELLS WET PREP: NONE SEEN
Sperm: NONE SEEN
TRICH WET PREP: NONE SEEN

## 2018-05-21 LAB — COMPREHENSIVE METABOLIC PANEL
ALK PHOS: 45 U/L (ref 38–126)
ALT: 20 U/L (ref 0–44)
ANION GAP: 11 (ref 5–15)
AST: 21 U/L (ref 15–41)
Albumin: 3.5 g/dL (ref 3.5–5.0)
BILIRUBIN TOTAL: 0.8 mg/dL (ref 0.3–1.2)
BUN: 10 mg/dL (ref 6–20)
CALCIUM: 8.6 mg/dL — AB (ref 8.9–10.3)
CO2: 21 mmol/L — ABNORMAL LOW (ref 22–32)
Chloride: 102 mmol/L (ref 98–111)
Creatinine, Ser: 0.47 mg/dL (ref 0.44–1.00)
GLUCOSE: 90 mg/dL (ref 70–99)
Potassium: 3.6 mmol/L (ref 3.5–5.1)
Sodium: 134 mmol/L — ABNORMAL LOW (ref 135–145)
TOTAL PROTEIN: 7.3 g/dL (ref 6.5–8.1)

## 2018-05-21 LAB — CBC WITH DIFFERENTIAL/PLATELET
Basophils Absolute: 0 10*3/uL (ref 0.0–0.1)
Basophils Relative: 0 %
Eosinophils Absolute: 0 10*3/uL (ref 0.0–0.7)
Eosinophils Relative: 0 %
HCT: 37.1 % (ref 36.0–46.0)
Hemoglobin: 12.6 g/dL (ref 12.0–15.0)
LYMPHS ABS: 1 10*3/uL (ref 0.7–4.0)
Lymphocytes Relative: 10 %
MCH: 30.7 pg (ref 26.0–34.0)
MCHC: 34 g/dL (ref 30.0–36.0)
MCV: 90.5 fL (ref 78.0–100.0)
MONO ABS: 0.2 10*3/uL (ref 0.1–1.0)
MONOS PCT: 2 %
Neutro Abs: 8.1 10*3/uL — ABNORMAL HIGH (ref 1.7–7.7)
Neutrophils Relative %: 88 %
Platelets: 174 10*3/uL (ref 150–400)
RBC: 4.1 MIL/uL (ref 3.87–5.11)
RDW: 13.7 % (ref 11.5–15.5)
WBC: 9.3 10*3/uL (ref 4.0–10.5)

## 2018-05-21 LAB — GC/CHLAMYDIA PROBE AMP (~~LOC~~) NOT AT ARMC
CHLAMYDIA, DNA PROBE: NEGATIVE
Neisseria Gonorrhea: NEGATIVE

## 2018-05-21 LAB — POCT FERN TEST: POCT Fern Test: NEGATIVE

## 2018-05-21 MED ORDER — LACTATED RINGERS IV BOLUS
2000.0000 mL | Freq: Once | INTRAVENOUS | Status: AC
  Administered 2018-05-21 (×2): 1000 mL via INTRAVENOUS

## 2018-05-21 MED ORDER — PROMETHAZINE HCL 25 MG RE SUPP: 25.0000 mg | Freq: Four times a day (QID) | RECTAL | 1 refills | Status: DC | PRN

## 2018-05-21 MED ORDER — SODIUM CHLORIDE 0.9 % IV SOLN
8.0000 mg | Freq: Once | INTRAVENOUS | Status: AC
  Administered 2018-05-21: 8 mg via INTRAVENOUS
  Filled 2018-05-21: qty 4

## 2018-05-21 NOTE — Discharge Instructions (Signed)
Bland Diet A bland diet consists of foods that do not have a lot of fat or fiber. Foods without fat or fiber are easier for the body to digest. They are also less likely to irritate your mouth, throat, stomach, and other parts of your gastrointestinal tract. A bland diet is sometimes called a BRAT diet. What is my plan? Your health care provider or dietitian may recommend specific changes to your diet to prevent and treat your symptoms, such as:  Eating small meals often.  Cooking food until it is soft enough to chew easily.  Chewing your food well.  Drinking fluids slowly.  Not eating foods that are very spicy, sour, or fatty.  Not eating citrus fruits, such as oranges and grapefruit.  What do I need to know about this diet?  Eat a variety of foods from the bland diet food list.  Do not follow a bland diet longer than you have to.  Ask your health care provider whether you should take vitamins. What foods can I eat? Grains  Hot cereals, such as cream of wheat. Bread, crackers, or tortillas made from refined white flour. Rice. Vegetables Canned or cooked vegetables. Mashed or boiled potatoes. Fruits Bananas. Applesauce. Other types of cooked or canned fruit with the skin and seeds removed, such as canned peaches or pears. Meats and Other Protein Sources Scrambled eggs. Creamy peanut butter or other nut butters. Lean, well-cooked meats, such as chicken or fish. Tofu. Soups or broths. Dairy Low-fat dairy products, such as milk, cottage cheese, or yogurt. Beverages Water. Herbal tea. Apple juice. Sweets and Desserts Pudding. Custard. Fruit gelatin. Ice cream. Fats and Oils Mild salad dressings. Canola or olive oil. The items listed above may not be a complete list of allowed foods or beverages. Contact your dietitian for more options. What foods are not recommended? Foods and ingredients that are often not recommended include:  Spicy foods, such as hot sauce or  salsa.  Fried foods.  Sour foods, such as pickled or fermented foods.  Raw vegetables or fruits, especially citrus or berries.  Caffeinated drinks.  Alcohol.  Strongly flavored seasonings or condiments.  The items listed above may not be a complete list of foods and beverages that are not allowed. Contact your dietitian for more information. This information is not intended to replace advice given to you by your health care provider. Make sure you discuss any questions you have with your health care provider. Document Released: 02/14/2016 Document Revised: 03/30/2016 Document Reviewed: 11/04/2014 Elsevier Interactive Patient Education  2018 [employer] diet.

## 2018-05-21 NOTE — MAU Provider Note (Signed)
History     Chief Complaint  Patient presents with  . Emesis   26 yo G3P1011 BF @ 21 4/[redacted] weeks gestation presents with c/o vomiting since 10 am. Denies fever. ahd nl BM today. Pt c/o change in vaginal discharge since Sunday. (+) abdominal cramping. (+) FM No vaginal bleeding. (+) intercourse Sat/Sunday. Pt c/o inability to keep anything down. No family member ill.   OB History    Gravida  3   Para  1   Term  1   Preterm      AB  1   Living  1     SAB  1   TAB      Ectopic      Multiple  0   Live Births  1           Past Medical History:  Diagnosis Date  . Carpal tunnel syndrome during pregnancy     Past Surgical History:  Procedure Laterality Date  . NO PAST SURGERIES      Family History  Problem Relation Age of Onset  . Cancer Paternal Aunt   . Diabetes Paternal Aunt   . Diabetes Maternal Grandfather   . Diabetes Paternal Grandmother   . Alcohol abuse Neg Hx   . Arthritis Neg Hx   . Asthma Neg Hx   . Birth defects Neg Hx   . COPD Neg Hx   . Depression Neg Hx   . Drug abuse Neg Hx   . Early death Neg Hx   . Hearing loss Neg Hx   . Heart disease Neg Hx   . Hyperlipidemia Neg Hx   . Hypertension Neg Hx   . Kidney disease Neg Hx   . Learning disabilities Neg Hx   . Mental illness Neg Hx   . Mental retardation Neg Hx   . Miscarriages / Stillbirths Neg Hx   . Stroke Neg Hx   . Vision loss Neg Hx   . Varicose Veins Neg Hx     Social History   Tobacco Use  . Smoking status: Former Smoker    Last attempt to quit: 06/07/2015    Years since quitting: 2.9  . Smokeless tobacco: Never Used  Substance Use Topics  . Alcohol use: No  . Drug use: No    Allergies: No Known Allergies  Medications Prior to Admission  Medication Sig Dispense Refill Last Dose  . promethazine (PHENERGAN) 25 MG tablet Take 25 mg by mouth every 6 (six) hours as needed for nausea or vomiting.   05/20/2018 at 1600  . Prenatal Vit-Fe Fumarate-FA (PRENATAL MULTIVITAMIN)  TABS tablet Take 1 tablet by mouth daily at 12 noon.   04/03/2018 at Unknown time     Physical Exam   Blood pressure (!) 122/56, pulse 97, temperature 99.8 F (37.7 C), temperature source Oral, resp. rate 18, height 5\' 5"  (1.651 m), weight 77.3 kg (170 lb 8 oz), last menstrual period 11/24/2017, not currently breastfeeding.  BP (!) 122/56 (BP Location: Right Arm)   Pulse 97   Temp 99.8 F (37.7 C) (Oral)   Resp 18   Ht 5\' 5"  (1.651 m)   Wt 77.3 kg (170 lb 8 oz)   LMP 11/24/2017   BMI 28.37 kg/m  Lungs: clear to auscultation bilaterally Heart: regular rate and rhythm, S1, S2 normal, no murmur, click, rub or gallop Abdomen: gravid tender Pelvic: adnexae not palpable, cervix normal in appearance, external genitalia normal, no adnexal masses or tenderness and scant white  d/c, fern, wet prep done, cultures. cervx closed /thick/OOp, uteerus, gravid tender Extremities: no edema, redness or tenderness in the calves or thighs  FHR 160 ED Course  IMP; N/v in pregnancy IUP@ 21 4/7 weeks Vaginal discharge P) cbc w/ diff, cmp. Wet prep, g/c. Fern done. OB sono check fluid MDM   Serita Kyle, MD 12:08 AM 05/21/2018  addendum  CBC Latest Ref Rng & Units 05/21/2018 07/08/2017 06/11/2016  WBC 4.0 - 10.5 K/uL 9.3 8.1 9.6  Hemoglobin 12.0 - 15.0 g/dL 16.1 09.6 10.8(L)  Hematocrit 36.0 - 46.0 % 37.1 39.5 32.4(L)  Platelets 150 - 400 K/uL 174 222 131(L)   CMP Latest Ref Rng & Units 05/21/2018 06/09/2016  Glucose 70 - 99 mg/dL 90 91  BUN 6 - 20 mg/dL 10 11  Creatinine 0.45 - 1.00 mg/dL 4.09 8.11  Sodium 914 - 145 mmol/L 134(L) 130(L)  Potassium 3.5 - 5.1 mmol/L 3.6 4.0  Chloride 98 - 111 mmol/L 102 103  CO2 22 - 32 mmol/L 21(L) 19(L)  Calcium 8.9 - 10.3 mg/dL 7.8(G) 8.9  Total Protein 6.5 - 8.1 g/dL 7.3 6.2(L)  Total Bilirubin 0.3 - 1.2 mg/dL 0.8 0.6  Alkaline Phos 38 - 126 U/L 45 199(H)  AST 15 - 41 U/L 21 23  ALT 0 - 44 U/L 20 28  wet prep (+) yeast Sono: nl fluid  ucx sent G/C  pending  IMP; dehyration N/v in preg. P) 2L IVF , Zofran IV. D/c home on phenergan PR. Keep sched OB appt [place] Mfm Ob Limited  Result Date: 05/21/2018 ----------------------------------------------------------------------  OBSTETRICS REPORT                      (Signed Final 05/21/2018 09:12 am) ---------------------------------------------------------------------- Patient Info  ID #:       [id number]                          D.O.B.:  Dec 02, 1990 (26 yrs)  Name:       [facility]                 Visit Date: 05/21/2018 12:43 am ---------------------------------------------------------------------- Performed By  Performed By:     Lenise Arena        Referred By:      MAU Nursing-                    RDMS                                     MAU/Triage  Attending:        Noralee Space MD        Location:         [facility] ---------------------------------------------------------------------- Orders   #  Description                                 Code   1  [place] MFM OB LIMITED                           [phone]  ----------------------------------------------------------------------   #  Ordered By               Order #        Accession #  Episode #   1  Nena JordanSHERONETTE               [id number]      [id number]     [id number]      Raul Torrance  ---------------------------------------------------------------------- Indications   [redacted] weeks gestation of pregnancy                Z3A.21   Leakage of amniotic fluid                      O42.90  ---------------------------------------------------------------------- OB History  Blood Type:            Height:  5'5"   Weight (lb):  170       BMI:  28.29  Gravidity:    3         Term:   1         SAB:   1  Living:       1 ---------------------------------------------------------------------- Fetal Evaluation  Num Of Fetuses:     1  Fetal Heart         167  Rate(bpm):  Cardiac Activity:   Observed  Presentation:       Cephalic  Placenta:           Posterior  Amniotic Fluid  AFI FV:       Subjectively within normal limits                              Largest Pocket(cm)                              3.5 ---------------------------------------------------------------------- Gestational Age  Best:          21w 4d     Det. By:  Previous Ultrasound      EDD:   09/27/18 ---------------------------------------------------------------------- Anatomy  Ventricles:            Appears normal         Abdomen:                Appears normal  Thoracic:              Appears normal         Bladder:                Appears normal  Stomach:               Appears normal, left                         sided ---------------------------------------------------------------------- Cervix Uterus Adnexa  Cervix  Length:           3.49  cm.  Normal appearance by transabdominal scan.  Uterus  No abnormality visualized. ---------------------------------------------------------------------- Impression  Patient was evaluated in the MAU with c/o vomiting,  abdominal cramping and possible leakage of amniotic fluid.  This study was remotely read. A limited ultrasound study was  performed. Amniotic fluid is normal and good fetal activity is  seen.  On transabdominal scan, the cervix looks long and closed. ---------------------------------------------------------------------- Recommendations  Follow-up scans as clinically indicated. ----------------------------------------------------------------------                  Noralee Spaceavi Shankar, MD Electronically Signed Final Report   05/21/2018 09:12 am ---------------------------------------------------------------------- reviewed results with pt Had prev dx with yeast infection  but did not utilize treatment

## 2018-05-22 LAB — URINE CULTURE: SPECIAL REQUESTS: NORMAL

## 2018-05-23 DIAGNOSIS — O358XX0 Maternal care for other (suspected) fetal abnormality and damage, not applicable or unspecified: Secondary | ICD-10-CM | POA: Diagnosis not present

## 2018-05-23 DIAGNOSIS — O09292 Supervision of pregnancy with other poor reproductive or obstetric history, second trimester: Secondary | ICD-10-CM | POA: Diagnosis not present

## 2018-05-23 DIAGNOSIS — Z3A21 21 weeks gestation of pregnancy: Secondary | ICD-10-CM | POA: Diagnosis not present

## 2018-05-23 DIAGNOSIS — Z362 Encounter for other antenatal screening follow-up: Secondary | ICD-10-CM | POA: Diagnosis not present

## 2018-08-25 ENCOUNTER — Encounter (HOSPITAL_COMMUNITY): Payer: Self-pay | Admitting: *Deleted

## 2018-08-25 ENCOUNTER — Inpatient Hospital Stay (HOSPITAL_COMMUNITY)
Admission: AD | Admit: 2018-08-25 | Discharge: 2018-08-25 | Disposition: A | Discharge status: Home / Self Care | Payer: Medicaid Other | Source: Ambulatory Visit | Attending: Obstetrics and Gynecology | Admitting: Obstetrics and Gynecology

## 2018-08-25 DIAGNOSIS — Z87891 Personal history of nicotine dependence: Secondary | ICD-10-CM | POA: Insufficient documentation

## 2018-08-25 DIAGNOSIS — O4703 False labor before 37 completed weeks of gestation, third trimester: Secondary | ICD-10-CM | POA: Insufficient documentation

## 2018-08-25 DIAGNOSIS — Z3A35 35 weeks gestation of pregnancy: Secondary | ICD-10-CM | POA: Diagnosis not present

## 2018-08-25 DIAGNOSIS — O479 False labor, unspecified: Secondary | ICD-10-CM | POA: Diagnosis not present

## 2018-08-25 LAB — URINALYSIS, ROUTINE W REFLEX MICROSCOPIC
BILIRUBIN URINE: NEGATIVE
Glucose, UA: NEGATIVE mg/dL
Hgb urine dipstick: NEGATIVE
Ketones, ur: NEGATIVE mg/dL
NITRITE: NEGATIVE
PH: 5 (ref 5.0–8.0)
Protein, ur: 30 mg/dL — AB
SPECIFIC GRAVITY, URINE: 1.035 — AB (ref 1.005–1.030)

## 2018-08-25 NOTE — MAU Note (Signed)
Deborah Lozano is a 27 y.o. at [redacted]w[redacted]d here in MAU reporting: +contractions. Were every 5-6 minutes. But feels like they stopped since arriving.  Pain score: denies. Denies LOF or VB. Today's Vitals   08/25/18 1804 08/25/18 1811  BP:  111/65  Pulse:  91  Resp:  17  Temp:  98.2 F (36.8 C)  TempSrc:  Oral  SpO2:  100%  Weight: 88.1 kg    Body mass index is 32.3 kg/m.  Lab orders placed from triage: ua

## 2018-08-25 NOTE — MAU Provider Note (Signed)
Chief Complaint:  Contractions   First Provider Initiated Contact with Patient 08/25/18 1831     HPI: Deborah Lozano is a 27 y.o. G3P1011 at [redacted]w[redacted]d who presents to maternity admissions reporting contractions. Started this morning. Reports contractions every 6 minutes earlier today but states she feels like that have stopped since arriving to MAU. Denies n/v/d, dysuria, vaginal discharge, vaginal bleeding, or LOF. Positive fetal movement.    Past Medical History:  Diagnosis Date  . Carpal tunnel syndrome during pregnancy    OB History  Gravida Para Term Preterm AB Living  3 1 1   1 1   SAB TAB Ectopic Multiple Live Births  1     0 1    # Outcome Date GA Lbr Len/2nd Weight Sex Delivery Anes PTL Lv  3 Current           2 Term 06/10/16 [redacted]w[redacted]d 15:11 / 00:23 3269 g M Vag-Spont EPI  LIV  1 SAB            Past Surgical History:  Procedure Laterality Date  . NO PAST SURGERIES     Family History  Problem Relation Age of Onset  . Cancer Paternal Aunt   . Diabetes Paternal Aunt   . Diabetes Maternal Grandfather   . Diabetes Paternal Grandmother   . Alcohol abuse Neg Hx   . Arthritis Neg Hx   . Asthma Neg Hx   . Birth defects Neg Hx   . COPD Neg Hx   . Depression Neg Hx   . Drug abuse Neg Hx   . Early death Neg Hx   . Hearing loss Neg Hx   . Heart disease Neg Hx   . Hyperlipidemia Neg Hx   . Hypertension Neg Hx   . Kidney disease Neg Hx   . Learning disabilities Neg Hx   . Mental illness Neg Hx   . Mental retardation Neg Hx   . Miscarriages / Stillbirths Neg Hx   . Stroke Neg Hx   . Vision loss Neg Hx   . Varicose Veins Neg Hx    Social History   Tobacco Use  . Smoking status: Former Smoker    Last attempt to quit: 06/07/2015    Years since quitting: 3.2  . Smokeless tobacco: Never Used  Substance Use Topics  . Alcohol use: No  . Drug use: No   No Known Allergies No medications prior to admission.    I have reviewed patient's Past Medical Hx, Surgical Hx, Family Hx,  Social Hx, medications and allergies.   ROS:  Review of Systems  Constitutional: Negative.   Gastrointestinal: Positive for abdominal pain (none currently).  Genitourinary: Negative.     Physical Exam   Patient Vitals for the past 24 hrs:  BP Temp Temp src Pulse Resp SpO2 Weight  08/25/18 1853 119/62 - - 91 17 100 % -  08/25/18 1811 111/65 98.2 F (36.8 C) Oral 91 17 100 % -  08/25/18 1804 - - - - - - 88.1 kg    Constitutional: Well-developed, well-nourished female in no acute distress.  Cardiovascular: normal rate & rhythm, no murmur Respiratory: normal effort, lung sounds clear throughout GI: Abd soft, non-tender, gravid appropriate for gestational age. Pos BS x 4 MS: Extremities nontender, no edema, normal ROM Neurologic: Alert and oriented x 4.  GU:   Dilation: Fingertip Effacement (%): Thick Cervical Position: Posterior Exam by:: Judeth Horn, NP  NST:  Baseline: 150 bpm, Variability: Good {> 6 bpm),  Accelerations: Reactive and Decelerations: Absent   Labs: Results for orders placed or performed during the hospital encounter of 08/25/18 (from the past 24 hour(s))  Urinalysis, Routine w reflex microscopic     Status: Abnormal   Collection Time: 08/25/18  6:09 PM  Result Value Ref Range   Color, Urine YELLOW YELLOW   APPearance HAZY (A) CLEAR   Specific Gravity, Urine 1.035 (H) 1.005 - 1.030   pH 5.0 5.0 - 8.0   Glucose, UA NEGATIVE NEGATIVE mg/dL   Hgb urine dipstick NEGATIVE NEGATIVE   Bilirubin Urine NEGATIVE NEGATIVE   Ketones, ur NEGATIVE NEGATIVE mg/dL   Protein, ur 30 (A) NEGATIVE mg/dL   Nitrite NEGATIVE NEGATIVE   Leukocytes, UA TRACE (A) NEGATIVE   RBC / HPF 0-5 0 - 5 RBC/hpf   WBC, UA 0-5 0 - 5 WBC/hpf   Bacteria, UA RARE (A) NONE SEEN   Squamous Epithelial / LPF 6-10 0 - 5   Mucus PRESENT     Imaging:  No results found.  MAU Course: Orders Placed This Encounter  Procedures  . Culture, OB Urine  . Urinalysis, Routine w reflex microscopic   . Discharge patient   No orders of the defined types were placed in this encounter.   MDM: Reactive NST No ctx & pt reports they have stopped since arrival Cervix ext closed/thick/poserior Assessment: 1. Braxton Hicks contractions   2. [redacted] weeks gestation of pregnancy     Plan: Discharge home in stable condition.  Labor precautions and fetal kick counts Keep f/u in office Discussed reasons to return to MAU  Follow-up Information    [facility] Follow up.   Specialty:  Obstetrics and Gynecology Contact information: [address] [place] [address] [phone]          Allergies as of 08/25/2018   No Known Allergies     Medication List    TAKE these medications   prenatal multivitamin Tabs tablet Take 1 tablet by mouth daily at 12 noon.   promethazine 25 MG tablet Commonly known as:  PHENERGAN Take 25 mg by mouth every 6 (six) hours as needed for nausea or vomiting.   promethazine 25 MG suppository Commonly known as:  PHENERGAN Place 1 suppository (25 mg total) rectally every 6 (six) hours as needed for nausea or vomiting.       Judeth Horn, NP 08/25/2018 8:18 PM

## 2018-08-25 NOTE — Discharge Instructions (Signed)
Braxton Hicks Contractions °Contractions of the uterus can occur throughout pregnancy, but they are not always a sign that you are in labor. You may have practice contractions called Braxton Hicks contractions. These false labor contractions are sometimes confused with true labor. °What are Braxton Hicks contractions? °Braxton Hicks contractions are tightening movements that occur in the muscles of the uterus before labor. Unlike true labor contractions, these contractions do not result in opening (dilation) and thinning of the cervix. Toward the end of pregnancy (32-34 weeks), Braxton Hicks contractions can happen more often and may become stronger. These contractions are sometimes difficult to tell apart from true labor because they can be very uncomfortable. You should not feel embarrassed if you go to the hospital with false labor. °Sometimes, the only way to tell if you are in true labor is for your health care provider to look for changes in the cervix. The health care provider will do a physical exam and may monitor your contractions. If you are not in true labor, the exam should show that your cervix is not dilating and your water has not broken. °If there are other health problems associated with your pregnancy, it is completely safe for you to be sent home with false labor. You may continue to have Braxton Hicks contractions until you go into true labor. °How to tell the difference between true labor and false labor °True labor °· Contractions last 30-70 seconds. °· Contractions become very regular. °· Discomfort is usually felt in the top of the uterus, and it spreads to the lower abdomen and low back. °· Contractions do not go away with walking. °· Contractions usually become more intense and increase in frequency. °· The cervix dilates and gets thinner. °False labor °· Contractions are usually shorter and not as strong as true labor contractions. °· Contractions are usually irregular. °· Contractions  are often felt in the front of the lower abdomen and in the groin. °· Contractions may go away when you walk around or change positions while lying down. °· Contractions get weaker and are shorter-lasting as time goes on. °· The cervix usually does not dilate or become thin. °Follow these instructions at home: °· Take over-the-counter and prescription medicines only as told by your health care provider. °· Keep up with your usual exercises and follow other instructions from your health care provider. °· Eat and drink lightly if you think you are going into labor. °· If Braxton Hicks contractions are making you uncomfortable: °? Change your position from lying down or resting to walking, or change from walking to resting. °? Sit and rest in a tub of warm water. °? Drink enough fluid to keep your urine pale yellow. Dehydration may cause these contractions. °? Do slow and deep breathing several times an hour. °· Keep all follow-up prenatal visits as told by your health care provider. This is important. °Contact a health care provider if: °· You have a fever. °· You have continuous pain in your abdomen. °Get help right away if: °· Your contractions become stronger, more regular, and closer together. °· You have fluid leaking or gushing from your vagina. °· You pass blood-tinged mucus (bloody show). °· You have bleeding from your vagina. °· You have low back pain that you never had before. °· You feel your baby’s head pushing down and causing pelvic pressure. °· Your baby is not moving inside you as much as it used to. °Summary °· Contractions that occur before labor are called Braxton   Hicks contractions, false labor, or practice contractions. °· Braxton Hicks contractions are usually shorter, weaker, farther apart, and less regular than true labor contractions. True labor contractions usually become progressively stronger and regular and they become more frequent. °· Manage discomfort from Braxton Hicks contractions by  changing position, resting in a warm bath, drinking plenty of water, or practicing deep breathing. °This information is not intended to replace advice given to you by your health care provider. Make sure you discuss any questions you have with your health care provider. °Document Released: 03/08/2017 Document Revised: 03/08/2017 Document Reviewed: 03/08/2017 °Elsevier Interactive Patient Education © 2018 [employer] Inc[employer] ° °Fetal Movement Counts °Patient Name: ________________________________________________ Patient Due Date: ____________________ °What is a fetal movement count? °A fetal movement count is the number of times that you feel your baby move during a certain amount of time. This may also be called a fetal kick count. A fetal movement count is recommended for every pregnant woman. You may be asked to start counting fetal movements as early as week 28 of your pregnancy. °Pay attention to when your baby is most active. You may notice your baby's sleep and wake cycles. You may also notice things that make your baby move more. You should do a fetal movement count: °· When your baby is normally most active. °· At the same time each day. ° °A good time to count movements is while you are resting, after having something to eat and drink. °How do I count fetal movements? °1. Find a quiet, comfortable area. Sit, or lie down on your side. °2. Write down the date, the start time and stop time, and the number of movements that you felt between those two times. Take this information with you to your health care visits. °3. For 2 hours, count kicks, flutters, swishes, rolls, and jabs. You should feel at least 10 movements during 2 hours. °4. You may stop counting after you have felt 10 movements. °5. If you do not feel 10 movements in 2 hours, have something to eat and drink. Then, keep resting and counting for 1 hour. If you feel at least 4 movements during that hour, you may stop counting. °Contact a health care  provider if: °· You feel fewer than 4 movements in 2 hours. °· Your baby is not moving like he or she usually does. °Date: ____________ Start time: ____________ Stop time: ____________ Movements: ____________ °Date: ____________ Start time: ____________ Stop time: ____________ Movements: ____________ °Date: ____________ Start time: ____________ Stop time: ____________ Movements: ____________ °Date: ____________ Start time: ____________ Stop time: ____________ Movements: ____________ °Date: ____________ Start time: ____________ Stop time: ____________ Movements: ____________ °Date: ____________ Start time: ____________ Stop time: ____________ Movements: ____________ °Date: ____________ Start time: ____________ Stop time: ____________ Movements: ____________ °Date: ____________ Start time: ____________ Stop time: ____________ Movements: ____________ °Date: ____________ Start time: ____________ Stop time: ____________ Movements: ____________ °This information is not intended to replace advice given to you by your health care provider. Make sure you discuss any questions you have with your health care provider. °Document Released: 11/22/2006 Document Revised: 06/21/2016 Document Reviewed: 12/02/2015 °Elsevier Interactive Patient Education © 2018 [employer] Inc. ° °

## 2018-08-27 LAB — CULTURE, OB URINE

## 2018-09-02 LAB — OB RESULTS CONSOLE GBS: GBS: POSITIVE

## 2018-09-04 DIAGNOSIS — Z22330 Carrier of Group B streptococcus: Secondary | ICD-10-CM | POA: Insufficient documentation

## 2018-09-18 ENCOUNTER — Other Ambulatory Visit: Payer: Self-pay

## 2018-09-18 ENCOUNTER — Encounter (HOSPITAL_COMMUNITY): Payer: Self-pay

## 2018-09-18 ENCOUNTER — Inpatient Hospital Stay (HOSPITAL_COMMUNITY)
Admission: AD | Admit: 2018-09-18 | Discharge: 2018-09-20 | DRG: 807 | Disposition: A | Discharge status: Home / Self Care | Payer: Medicaid Other | Attending: Obstetrics & Gynecology | Admitting: Obstetrics & Gynecology

## 2018-09-18 DIAGNOSIS — O99824 Streptococcus B carrier state complicating childbirth: Secondary | ICD-10-CM | POA: Diagnosis present

## 2018-09-18 DIAGNOSIS — Z87891 Personal history of nicotine dependence: Secondary | ICD-10-CM | POA: Diagnosis not present

## 2018-09-18 DIAGNOSIS — O99214 Obesity complicating childbirth: Secondary | ICD-10-CM | POA: Diagnosis present

## 2018-09-18 DIAGNOSIS — Z3A38 38 weeks gestation of pregnancy: Secondary | ICD-10-CM | POA: Diagnosis not present

## 2018-09-18 DIAGNOSIS — Z23 Encounter for immunization: Secondary | ICD-10-CM

## 2018-09-18 DIAGNOSIS — E669 Obesity, unspecified: Secondary | ICD-10-CM | POA: Diagnosis present

## 2018-09-18 DIAGNOSIS — Z3483 Encounter for supervision of other normal pregnancy, third trimester: Secondary | ICD-10-CM | POA: Diagnosis present

## 2018-09-18 LAB — URINALYSIS, ROUTINE W REFLEX MICROSCOPIC
BILIRUBIN URINE: NEGATIVE
GLUCOSE, UA: NEGATIVE mg/dL
Hgb urine dipstick: NEGATIVE
KETONES UR: NEGATIVE mg/dL
Nitrite: NEGATIVE
Protein, ur: 30 mg/dL — AB
SPECIFIC GRAVITY, URINE: 1.024 (ref 1.005–1.030)
WBC, UA: >50 WBC/hpf — ABNORMAL HIGH (ref 0–5)
pH: 6 (ref 5.0–8.0)

## 2018-09-18 LAB — TYPE AND SCREEN
ABO/RH(D): O POS
ANTIBODY SCREEN: NEGATIVE

## 2018-09-18 LAB — CBC
HEMATOCRIT: 37.6 % (ref 36.0–46.0)
HEMOGLOBIN: 12.7 g/dL (ref 12.0–15.0)
MCH: 30.9 pg (ref 26.0–34.0)
MCHC: 33.8 g/dL (ref 30.0–36.0)
MCV: 91.5 fL (ref 80.0–100.0)
Platelets: 158 10*3/uL (ref 150–400)
RBC: 4.11 MIL/uL (ref 3.87–5.11)
RDW: 14.1 % (ref 11.5–15.5)
WBC: 9.7 10*3/uL (ref 4.0–10.5)
nRBC: 0 % (ref 0.0–0.2)

## 2018-09-18 LAB — POCT FERN TEST: POCT Fern Test: POSITIVE

## 2018-09-18 MED ORDER — ONDANSETRON HCL 4 MG/2ML IJ SOLN: 4.0000 mg | Freq: Four times a day (QID) | INTRAMUSCULAR | Status: DC | PRN

## 2018-09-18 MED ORDER — SODIUM CHLORIDE 0.9 % IV SOLN
5.0000 10*6.[IU] | Freq: Once | INTRAVENOUS | Status: AC
  Administered 2018-09-18: 5 10*6.[IU] via INTRAVENOUS
  Filled 2018-09-18: qty 5

## 2018-09-18 MED ORDER — LACTATED RINGERS IV SOLN
INTRAVENOUS | Status: DC
  Administered 2018-09-18 – 2018-09-19 (×2): via INTRAVENOUS

## 2018-09-18 MED ORDER — LIDOCAINE HCL (PF) 1 % IJ SOLN
30.0000 mL | INTRAMUSCULAR | Status: DC | PRN
  Filled 2018-09-18: qty 30

## 2018-09-18 MED ORDER — SOD CITRATE-CITRIC ACID 500-334 MG/5ML PO SOLN: 30.0000 mL | ORAL | Status: DC | PRN

## 2018-09-18 MED ORDER — OXYTOCIN 40 UNITS IN LACTATED RINGERS INFUSION - SIMPLE MED
2.5000 [IU]/h | INTRAVENOUS | Status: DC
  Filled 2018-09-18: qty 1000

## 2018-09-18 MED ORDER — LACTATED RINGERS IV SOLN: 500.0000 mL | INTRAVENOUS | Status: DC | PRN

## 2018-09-18 MED ORDER — PRENATAL MULTIVITAMIN CH: 1.0000 | ORAL_TABLET | Freq: Every day | ORAL | Status: DC

## 2018-09-18 MED ORDER — OXYCODONE-ACETAMINOPHEN 5-325 MG PO TABS: 1.0000 | ORAL_TABLET | ORAL | Status: DC | PRN

## 2018-09-18 MED ORDER — PENICILLIN G 3 MILLION UNITS IVPB - SIMPLE MED
3.0000 10*6.[IU] | INTRAVENOUS | Status: DC
  Administered 2018-09-19 (×2): 3 10*6.[IU] via INTRAVENOUS
  Filled 2018-09-18 (×4): qty 100

## 2018-09-18 MED ORDER — OXYCODONE-ACETAMINOPHEN 5-325 MG PO TABS: 2.0000 | ORAL_TABLET | ORAL | Status: DC | PRN

## 2018-09-18 MED ORDER — ACETAMINOPHEN 325 MG PO TABS: 650.0000 mg | ORAL_TABLET | ORAL | Status: DC | PRN

## 2018-09-18 MED ORDER — OXYTOCIN BOLUS FROM INFUSION: 500.0000 mL | Freq: Once | INTRAVENOUS | Status: DC

## 2018-09-18 NOTE — H&P (Signed)
Deborah Lozano is a 27 y.o. female, G3P1011  at 38+5  weeks. Called by MAU for pt presenting for SROM in early labor, + fern, clear fluid. 3.5/80/-3. Category 1 strip.  Patient Active Problem List   Diagnosis Date Noted  . Normal labor 09/18/2018  . Vaginal delivery 06/10/2016  . Maternal varicella, non-immune 06/09/2016  . Positive GBS test 06/09/2016  . Obesity 06/09/2016  . Carpal tunnel syndrome during pregnancy 06/09/2016    History of present pregnancy: [facility] 25+6 [facility]   EDC of 09/27/18 was established by LMP and 6 week US.   Anatomy scan:  with normal findings and a posteior placenta.     OB History    Gravida  3   Para  1   Term  1   Preterm      AB  1   Living  1     SAB  1   TAB      Ectopic      Multiple  0   Live Births  1          Past Medical History:  Diagnosis Date  . Carpal tunnel syndrome during pregnancy    Past Surgical History:  Procedure Laterality Date  . NO PAST SURGERIES     Family History: family history includes Cancer in her paternal aunt; Diabetes in her maternal grandfather, paternal aunt, and paternal grandmother. Social History:  reports that she quit smoking about 3 years ago. She has never used smokeless tobacco. She reports that she does not drink alcohol or use drugs. Heterosexual, AA female, partner [place].  Prenatal Transfer Tool  Maternal Diabetes: No Genetic Screening: Normal Maternal Ultrasounds/Referrals: Normal Fetal Ultrasounds or other Referrals:  None Maternal Substance Abuse:  No Significant Maternal Medications:  None Significant Maternal Lab Results: None  TDAP UTD Flu UTD  ROS:  All ten systems reviewed, unremarkable except as noted above.  No Known Allergies  Dilation: 3.5 Effacement (%): 80 Station: -3 Exam by:: Saks Incorporatedikki Risheq, RN  Blood pressure 140/75, pulse 80, temperature 98.1 F (36.7 C), temperature source Oral, resp. rate 16, height 5\' 5"   (1.651 m), weight 92.1 kg, last menstrual period 11/24/2017, SpO2 99 %.  Vitals:   09/18/18 1955 09/18/18 2021  BP: (!) 156/72 140/75  Pulse: 88 80  Resp: 18 16  Temp: 98.1 F (36.7 C)   TempSrc: Oral   SpO2:  99%  Weight: 92.1 kg   Height: 5\' 5"  (1.651 m)     Chest clear Heart RRR without murmur Abd gravid, NT, FH moderate variability, +accels, no decles Pelvic: performed in MAU Ext: neg Homan's  FHR: Category 1 UCs:  2-3 in 10 minutes  Prenatal labs: ABO, Rh:   O+ Antibody:   Rubella:   Imm RPR:   neg HBsAg:   neg HIV:   neg GBS: pos   Sickle cell/Hgb electrophoresis:  O+ GC:  neg Chlamydia:  neg Genetic screenings: low risk Panorama Glucola: 1 hour WNL Other:   Hgb 13.2 at NOB, 11.7 at 28 weeks    Assessment/Plan: IUP at 38+5, SROM Cat 1 FH tracing  [facility]: [facility]  Routine [facility] orders Pain med/epidural prn PCN G for GBS prophylaxis  Anticipate SVD  Henderson NewcomerNancy Jean ProtheroCNM, MN 09/18/2018, 10:02 PM

## 2018-09-18 NOTE — MAU Note (Signed)
Pt here with c/o ROM at 1815, clear fluid. Also having contractions. Was checked in the office week before last and was closed. Reports good fetal movement.

## 2018-09-19 ENCOUNTER — Inpatient Hospital Stay (HOSPITAL_COMMUNITY): Payer: Medicaid Other | Admitting: Anesthesiology

## 2018-09-19 ENCOUNTER — Encounter (HOSPITAL_COMMUNITY): Payer: Self-pay | Admitting: Anesthesiology

## 2018-09-19 LAB — RPR: RPR Ser Ql: NONREACTIVE

## 2018-09-19 MED ORDER — LIDOCAINE HCL (PF) 1 % IJ SOLN
INTRAMUSCULAR | Status: DC | PRN
  Administered 2018-09-19 (×2): 4 mL via EPIDURAL

## 2018-09-19 MED ORDER — PHENYLEPHRINE 40 MCG/ML (10ML) SYRINGE FOR IV PUSH (FOR BLOOD PRESSURE SUPPORT)
80.0000 ug | PREFILLED_SYRINGE | INTRAVENOUS | Status: DC | PRN
  Filled 2018-09-19: qty 5
  Filled 2018-09-19: qty 10

## 2018-09-19 MED ORDER — PRENATAL MULTIVITAMIN CH
1.0000 | ORAL_TABLET | Freq: Every day | ORAL | Status: DC
  Administered 2018-09-19 – 2018-09-20 (×2): 1 via ORAL
  Filled 2018-09-19 (×3): qty 1

## 2018-09-19 MED ORDER — ONDANSETRON HCL 4 MG PO TABS: 4.0000 mg | ORAL_TABLET | ORAL | Status: DC | PRN

## 2018-09-19 MED ORDER — EPHEDRINE 5 MG/ML INJ
10.0000 mg | INTRAVENOUS | Status: DC | PRN
  Filled 2018-09-19: qty 2

## 2018-09-19 MED ORDER — INFLUENZA VAC SPLIT QUAD 0.5 ML IM SUSY
0.5000 mL | PREFILLED_SYRINGE | INTRAMUSCULAR | Status: AC
  Administered 2018-09-20: 0.5 mL via INTRAMUSCULAR
  Filled 2018-09-19: qty 0.5

## 2018-09-19 MED ORDER — LACTATED RINGERS IV SOLN: 500.0000 mL | Freq: Once | INTRAVENOUS | Status: DC

## 2018-09-19 MED ORDER — DIPHENHYDRAMINE HCL 50 MG/ML IJ SOLN: 12.5000 mg | INTRAMUSCULAR | Status: DC | PRN

## 2018-09-19 MED ORDER — BENZOCAINE-MENTHOL 20-0.5 % EX AERO
1.0000 "application " | INHALATION_SPRAY | CUTANEOUS | Status: DC | PRN
  Administered 2018-09-19: 1 via TOPICAL
  Filled 2018-09-19: qty 56

## 2018-09-19 MED ORDER — TERBUTALINE SULFATE 1 MG/ML IJ SOLN
0.2500 mg | Freq: Once | INTRAMUSCULAR | Status: DC | PRN
  Filled 2018-09-19: qty 1

## 2018-09-19 MED ORDER — ACETAMINOPHEN 325 MG PO TABS: 650.0000 mg | ORAL_TABLET | ORAL | Status: DC | PRN

## 2018-09-19 MED ORDER — DIPHENHYDRAMINE HCL 25 MG PO CAPS: 25.0000 mg | ORAL_CAPSULE | Freq: Four times a day (QID) | ORAL | Status: DC | PRN

## 2018-09-19 MED ORDER — OXYTOCIN 40 UNITS IN LACTATED RINGERS INFUSION - SIMPLE MED
1.0000 m[IU]/min | INTRAVENOUS | Status: DC
  Administered 2018-09-19: 2 m[IU]/min via INTRAVENOUS

## 2018-09-19 MED ORDER — FENTANYL CITRATE (PF) 100 MCG/2ML IJ SOLN
100.0000 ug | Freq: Once | INTRAMUSCULAR | Status: AC
  Administered 2018-09-19: 100 ug via INTRAVENOUS

## 2018-09-19 MED ORDER — WITCH HAZEL-GLYCERIN EX PADS: 1.0000 "application " | MEDICATED_PAD | CUTANEOUS | Status: DC | PRN

## 2018-09-19 MED ORDER — FENTANYL CITRATE (PF) 100 MCG/2ML IJ SOLN
INTRAMUSCULAR | Status: AC
  Filled 2018-09-19: qty 2

## 2018-09-19 MED ORDER — ONDANSETRON HCL 4 MG/2ML IJ SOLN: 4.0000 mg | INTRAMUSCULAR | Status: DC | PRN

## 2018-09-19 MED ORDER — DIBUCAINE 1 % RE OINT: 1.0000 "application " | TOPICAL_OINTMENT | RECTAL | Status: DC | PRN

## 2018-09-19 MED ORDER — PHENYLEPHRINE 40 MCG/ML (10ML) SYRINGE FOR IV PUSH (FOR BLOOD PRESSURE SUPPORT)
80.0000 ug | PREFILLED_SYRINGE | INTRAVENOUS | Status: DC | PRN
  Filled 2018-09-19: qty 5

## 2018-09-19 MED ORDER — COCONUT OIL OIL: 1.0000 "application " | TOPICAL_OIL | Status: DC | PRN

## 2018-09-19 MED ORDER — IBUPROFEN 600 MG PO TABS
600.0000 mg | ORAL_TABLET | Freq: Four times a day (QID) | ORAL | Status: DC
  Administered 2018-09-19 – 2018-09-20 (×5): 600 mg via ORAL
  Filled 2018-09-19 (×5): qty 1

## 2018-09-19 MED ORDER — TETANUS-DIPHTH-ACELL PERTUSSIS 5-2.5-18.5 LF-MCG/0.5 IM SUSP: 0.5000 mL | Freq: Once | INTRAMUSCULAR | Status: DC

## 2018-09-19 MED ORDER — FENTANYL 2.5 MCG/ML BUPIVACAINE 1/10 % EPIDURAL INFUSION (WH - ANES)
14.0000 mL/h | INTRAMUSCULAR | Status: DC | PRN
  Administered 2018-09-19: 14 mL/h via EPIDURAL
  Filled 2018-09-19: qty 100

## 2018-09-19 MED ORDER — ZOLPIDEM TARTRATE 5 MG PO TABS: 5.0000 mg | ORAL_TABLET | Freq: Every evening | ORAL | Status: DC | PRN

## 2018-09-19 MED ORDER — SIMETHICONE 80 MG PO CHEW: 80.0000 mg | CHEWABLE_TABLET | ORAL | Status: DC | PRN

## 2018-09-19 MED ORDER — SENNOSIDES-DOCUSATE SODIUM 8.6-50 MG PO TABS
2.0000 | ORAL_TABLET | ORAL | Status: DC
  Administered 2018-09-19: 2 via ORAL
  Filled 2018-09-19: qty 2

## 2018-09-19 NOTE — Anesthesia Preprocedure Evaluation (Signed)
Anesthesia Evaluation  Patient identified by MRN, date of birth, ID band Patient awake    Reviewed: Allergy & Precautions, H&P , Patient's Chart, lab work & pertinent test results  Airway Mallampati: II  TM Distance: >3 FB Neck ROM: full    Dental no notable dental hx. (+) Teeth Intact   Pulmonary neg pulmonary ROS, former smoker,    Pulmonary exam normal breath sounds clear to auscultation       Cardiovascular negative cardio ROS Normal cardiovascular exam Rhythm:regular Rate:Normal     Neuro/Psych negative neurological ROS  negative psych ROS   GI/Hepatic Neg liver ROS, GERD  Medicated and Controlled,  Endo/Other  negative endocrine ROS  Renal/GU negative Renal ROS  negative genitourinary   Musculoskeletal   Abdominal   Peds  Hematology negative hematology ROS (+)   Anesthesia Other Findings   Reproductive/Obstetrics (+) Pregnancy                             Anesthesia Physical Anesthesia Plan  ASA: II  Anesthesia Plan: Epidural   Post-op Pain Management:    Induction:   PONV Risk Score and Plan:   Airway Management Planned:   Additional Equipment:   Intra-op Plan:   Post-operative Plan:   Informed Consent: I have reviewed the patients History and Physical, chart, labs and discussed the procedure including the risks, benefits and alternatives for the proposed anesthesia with the patient or authorized representative who has indicated his/her understanding and acceptance.     Plan Discussed with: Anesthesiologist  Anesthesia Plan Comments:         Anesthesia Quick Evaluation

## 2018-09-19 NOTE — Anesthesia Procedure Notes (Signed)
Epidural Patient location during procedure: OB Start time: 09/19/2018 5:55 AM End time: 09/19/2018 6:04 AM  Staffing Anesthesiologist: Mal AmabileFoster, Orianna Biskup, MD Performed: anesthesiologist   Preanesthetic Checklist Completed: patient identified, site marked, surgical consent, pre-op evaluation, timeout performed, IV checked, risks and benefits discussed and monitors and equipment checked  Epidural Patient position: sitting Prep: site prepped and draped and DuraPrep Patient monitoring: continuous pulse ox and blood pressure Approach: midline Location: L3-L4 Injection technique: LOR air  Needle:  Needle type: Tuohy  Needle gauge: 17 G Needle length: 9 cm and 9 Needle insertion depth: 5 cm cm Catheter type: closed end flexible Catheter size: 19 Gauge Catheter at skin depth: 10 cm Test dose: negative and Other  Assessment Events: blood not aspirated, injection not painful, no injection resistance, negative IV test and no paresthesia  Additional Notes Patient identified. Risks and benefits discussed including failed block, incomplete  Pain control, post dural puncture headache, nerve damage, paralysis, blood pressure Changes, nausea, vomiting, reactions to medications-both toxic and allergic and post Partum back pain. All questions were answered. Patient expressed understanding and wished to proceed. Sterile technique was used throughout procedure. Epidural site was Dressed with sterile barrier dressing. No paresthesias, signs of intravascular injection Or signs of intrathecal spread were encountered.  Patient was more comfortable after the epidural was dosed. Please see RN's note for documentation of vital signs and FHR which are stable.

## 2018-09-19 NOTE — Progress Notes (Addendum)
Subjective: Pt sleeping through ctxns. Requested SVE. Desires epidural for pain control. Wants to try nitrous and IV pain meds first.  Objective: BP (!) 124/59   Pulse 97   Temp 98.1 F (36.7 C) (Oral)   Resp 16   Ht 5\' 5"  (1.651 m)   Wt 92.1 kg   LMP 11/24/2017   SpO2 99%   BMI 33.78 kg/m   FHT: baseline 140s, moderate variability,  One variable decel noted UC:   2 in 10 minutes SVE:   Dilation: 3.5 Effacement (%): 80 Station: -3 Exam by:: Zenia ResidesNikki Risheq, RN  Pitocin to be started at 522mu/min MVUs n/a  Assessment:  Early labor with SROM by 10h Category one strip GBS+  Plan: Discussed options with patient.  Augment with pitocin  N2O as needed, epidural as needed. Continue PCN for GBS  Kenney HousemanNancy Jean Mohmmad Saleeby CNM, MSN 09/19/2018, 3:52 AM

## 2018-09-19 NOTE — Anesthesia Postprocedure Evaluation (Signed)
Anesthesia Post Note  Patient: Deborah Lozano  Procedure(s) Performed: AN AD HOC LABOR EPIDURAL     Patient location during evaluation: Mother Baby Anesthesia Type: Epidural Level of consciousness: awake and alert Pain management: pain level controlled Vital Signs Assessment: post-procedure vital signs reviewed and stable Respiratory status: spontaneous breathing, nonlabored ventilation and respiratory function stable Cardiovascular status: stable Postop Assessment: no headache, no backache and epidural receding Anesthetic complications: no    Last Vitals:  Vitals:   09/19/18 0830 09/19/18 0840  BP: (!) 138/124 (!) 145/75  Pulse: 75 84  Resp: 16 16  Temp:    SpO2:      Last Pain:  Vitals:   09/19/18 0746  TempSrc:   PainSc: 6    Pain Goal:                 Christoph Copelan

## 2018-09-20 ENCOUNTER — Ambulatory Visit: Payer: Self-pay

## 2018-09-20 LAB — CBC
HEMATOCRIT: 35 % — AB (ref 36.0–46.0)
Hemoglobin: 11.5 g/dL — ABNORMAL LOW (ref 12.0–15.0)
MCH: 30.2 pg (ref 26.0–34.0)
MCHC: 32.9 g/dL (ref 30.0–36.0)
MCV: 91.9 fL (ref 80.0–100.0)
NRBC: 0 % (ref 0.0–0.2)
PLATELETS: 146 10*3/uL — AB (ref 150–400)
RBC: 3.81 MIL/uL — ABNORMAL LOW (ref 3.87–5.11)
RDW: 14.2 % (ref 11.5–15.5)
WBC: 8.3 10*3/uL (ref 4.0–10.5)

## 2018-09-20 MED ORDER — IBUPROFEN 600 MG PO TABS: 600.0000 mg | ORAL_TABLET | Freq: Four times a day (QID) | ORAL | 0 refills | Status: DC

## 2018-09-20 NOTE — Lactation Note (Signed)
This note was copied from a baby's chart. 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 Mom doesn't have a breast pump at home. Per mom, infant had 4 soiled and 3 voids. Mom desires to BF longer, she BF her son for 4 months. Per mom, BF for 15 to 20 minutes and then supplement with 5 ml of EBM (colostrum) by spoon. LC did not observe latch at this time, mom feed infant [facility] room. LC discussed I & O. Mom shown how to use DEBP & how to disassemble, clean, & reassemble parts. Reviewed Baby & Me book's Breastfeeding Basics.  Mom made aware of O/P services, breastfeeding support groups, community resources, and our phone # for post-discharge questions.   BF plans: 1. Mom will breastfeed according hunger cues, will not BF past 3 hours. 2. Mom will give back EBM according infant age/ hours. 3. Mom plans to use DEBP  every 3 hours  4. Mom will call Nurse and LC if she needs any assistance with BF.  Maternal Data    Feeding Feeding Type: Breast Fed  LATCH Score                   Interventions Interventions: Breast feeding basics reviewed;Hand express;Pre-pump if needed;Expressed milk;DEBP  Lactation Tools Discussed/Used WIC Program: Yes Pump Review: Setup, frequency, and cleaning;Milk Storage Initiated by:: Deborah Earthlyobin Ulmer Lozano, IBCLC Date initiated:: 09/20/18   Consult Status Consult Status: Follow-up Date: 09/20/18 Follow-up type: In-patient    Deborah Lozano 09/20/2018, 7:50 AM

## 2018-09-20 NOTE — Progress Notes (Signed)
CSW received and acknowledges consult for EDPS of 9.  Consult screened out due to 9 on EDPS does not warrant a CSW consult.  MOB whom scores are greater than 9/yes to question 10 on Edinburgh Postpartum Depression Screen warrants a CSW consult.   Bernadett Milian Shockley-Gilyard, MSW, LCSW Clinical Social Work [phone]  

## 2018-09-20 NOTE — Discharge Summary (Signed)
SVD OB Discharge Summary     Patient Name: Deborah Lozano DOB: 1990-12-21 MRN: [id number]  Date of admission: 09/18/2018 Delivering MD: Allayne StackBEARD, SAMANTHA N  Date of delivery: 09/19/2018 Type of delivery: SVD  Newborn Data: Sex: Baby Female Circumcision: Out pt desired Live born female  Birth Weight: 5 lb 12.1 oz (2610 g) APGAR: 9, 9  Newborn Delivery   Birth date/time:  09/19/2018 07:53:00 Delivery type:  Vaginal, Spontaneous     Feeding: breast Infant being discharge to home with mother in stable condition.   Admitting diagnosis: 38 wks water broke and ctx every 10 min Intrauterine pregnancy: 8185w6d     Secondary diagnosis:  Active Problems:   Normal labor   Normal postpartum course                                Complications: None                                                              Intrapartum Procedures: spontaneous vaginal delivery and GBS prophylaxis Postpartum Procedures: none Complications-Operative and Postpartum: none Augmentation: Pitocin   History of Present Illness: Ms. Deborah CedarKhadijah A Caggiano is a 27 y.o. female, [id number], who presents at 6785w6d weeks gestation. The patient [facility]  Her pregnancy has been complicated by:  Patient Active Problem List   Diagnosis Date Noted  . Normal postpartum course 09/20/2018  . Normal labor 09/18/2018  . Vaginal delivery 06/10/2016  . Maternal varicella, non-immune 06/09/2016  . Positive GBS test 06/09/2016  . Obesity 06/09/2016  . Carpal tunnel syndrome during pregnancy 06/09/2016   Hospital course:  Onset of Labor With Vaginal Delivery     27 y.o. yo [id number] at 4385w6d was admitted in Latent Labor on 09/18/2018. Patient had an uncomplicated labor course as follows:  Membrane Rupture Time/Date: 8:33 PM ,09/18/2018   Intrapartum Procedures: Episiotomy: None [1]                                         Lacerations:  None [1]  Patient had a delivery of a Viable  infant. 09/19/2018  Information for the patient's newborn:  Donney RankinsBoyd, Boy Nickolette [[id number]][[id number]]  Delivery Method: Vaginal, Spontaneous(Filed from Delivery Summary)    Pateint had an uncomplicated postpartum course.  She is ambulating, tolerating a regular diet, passing flatus, and urinating well. Patient is discharged home in stable condition on 09/20/18.  Postpartum Day # 2 : S/P NSVD due to SROM and spontaneous labor. Patient up ad lib, denies syncope or dizziness. Reports consuming regular diet without issues and denies N/V. Patient reports 0 bowel movement + passing flatus.  Denies issues with urination and reports bleeding is "lighter."  Patient is Breastfeeding and reports going well.  Desires nexplanon for postpartum contraception.  Pain is being appropriately managed with use of motrin. Pt wants early discharge,. PPD31 hgb 11.5  Physical exam  Vitals:   09/19/18 1554 09/19/18 1931 09/19/18 2300 09/20/18 0517  BP: (!) 132/58 126/66 133/79 123/71  Pulse: 71 68 81 77  Resp: 18 18 16  16  Temp: 98.5 F (36.9 C)  97.9 F (36.6 C) 98.2 F (36.8 C)  TempSrc: Oral  Oral Oral  SpO2: 100%     Weight:      Height:       General: alert, cooperative and no distress Lochia: appropriate Uterine Fundus: firm Perineum: Approx, intact no hematomas noted.  DVT Evaluation: No evidence of DVT seen on physical exam. Negative Homan's sign. No cords or calf tenderness.  Labs: Lab Results  Component Value Date   WBC 8.3 09/20/2018   HGB 11.5 (L) 09/20/2018   HCT 35.0 (L) 09/20/2018   MCV 91.9 09/20/2018   PLT 146 (L) 09/20/2018   CMP Latest Ref Rng & Units 05/21/2018  Glucose 70 - 99 mg/dL 90  BUN 6 - 20 mg/dL 10  Creatinine 1.61 - 0.96 mg/dL 0.45  Sodium 409 - 811 mmol/L 134(L)  Potassium 3.5 - 5.1 mmol/L 3.6  Chloride 98 - 111 mmol/L 102  CO2 22 - 32 mmol/L 21(L)  Calcium 8.9 - 10.3 mg/dL 9.1(Y)  Total Protein 6.5 - 8.1 g/dL 7.3  Total Bilirubin 0.3 - 1.2 mg/dL 0.8  Alkaline Phos 38 -  126 U/L 45  AST 15 - 41 U/L 21  ALT 0 - 44 U/L 20    Date of discharge: 09/20/2018 Discharge Diagnoses: Term Pregnancy-delivered Discharge instruction: per After Visit Summary and "Baby and Me Booklet".  After visit meds:  Allergies as of 09/20/2018   No Known Allergies     Medication List    TAKE these medications   ibuprofen 600 MG tablet Commonly known as:  ADVIL,MOTRIN Take 1 tablet (600 mg total) by mouth every 6 (six) hours.   prenatal multivitamin Tabs tablet Take 1 tablet by mouth daily at 12 noon.       Activity:           unrestricted and pelvic rest Advance as tolerated. Pelvic rest for 6 weeks.  Diet:                routine Medications: PNV and Ibuprofen Postpartum contraception: Nexplanon Condition:  Pt discharge to home with baby in stable  Meds: Allergies as of 09/20/2018   No Known Allergies     Medication List    TAKE these medications   ibuprofen 600 MG tablet Commonly known as:  ADVIL,MOTRIN Take 1 tablet (600 mg total) by mouth every 6 (six) hours.   prenatal multivitamin Tabs tablet Take 1 tablet by mouth daily at 12 noon.       Discharge Follow Up:  Follow-up Information    [facility] Follow up.   Specialty:  Obstetrics and Gynecology Why:  6 week PP visit with nexplanon placement  Contact information: [address] [place] [address] [phone]           [place], NP-C, CNM 09/20/2018, 9:31 AM  [place], FNP

## 2018-09-20 NOTE — Lactation Note (Signed)
This note was copied from a baby's chart. Lactation Consultation Note Baby 37 hrs old. Mom stated she had BF well. Baby cont. To root and cue but will not latch.  Mom has short shaft nipples, compressible breast. LC suggested mom to wear shells, mom agreed. Mom asked for comfort gels d/t soreness. LC gave mom CG. Mom pumped 11 ml from each breast via DEBP. MOM REQUEST lc TO GIVE bm W/CURVE TIP SYRING. W/gloved finger LC got baby suckling, took 8 ml. Baby tongue thrust and chews frequently. Has uncoordinated suck swallow. Mom had baby wrapped loosely in blanket. LC suggested no blanket, do STS while BF, may cover back of baby if feels he will get cold.  Baby constantly putting fist in mouth and rooting but will not latch or suck. Will not take anymore BM. Encouraged mom to burp baby and let baby rest, then try again later if baby cont. To cue. Discussed not to try feeding longer than 30 min d/t may stress baby. Alert RN if baby not latching or feeding well. Strict I&O encouraged.  LC doesn't recommend baby to be d/c home until feeding well.  Patient Name: Deborah Lozano ZOXWR'UToday's Date: 09/20/2018 Reason for consult: Follow-up assessment;Infant < 6lbs;Early term 37-38.6wks   Maternal Data Has patient been taught Hand Expression?: Yes Does the patient have breastfeeding experience prior to this delivery?: Yes  Feeding Feeding Type: Breast Milk  LATCH Score          Comfort (Breast/Nipple): Filling, red/small blisters or bruises, mild/mod discomfort        Interventions Interventions: Skin to skin;Expressed milk;Shells;DEBP;Pre-pump if needed  Lactation Tools Discussed/Used Tools: Shells;Pump Shell Type: Inverted Breast pump type: Double-Electric Breast Pump   Consult Status Consult Status: Follow-up Date: 09/21/18 Follow-up type: In-patient    Charyl DancerCARVER, [name] 09/20/2018, 9:23 PM

## 2018-09-21 ENCOUNTER — Ambulatory Visit: Payer: Self-pay

## 2018-09-21 NOTE — Lactation Note (Addendum)
This note was copied from a baby's chart. Lactation Consultation Note:  Infant is 148 hours old and is at 6.7 % weight loss. He is SGA. Infant has been breastfeeding well until this am. Mother reports that he has been sleepy since 4 am. Infant has had a total of 53 ml of ebm since midnight,  As well as breastfed 3 times.  # 5 fr feeding tube was sat up with 10 ml of ebm. Infant roused and nuzzled and licked the nipple a few times.  No sustained latch at this time.  Instructions given to use sns to stimulate mothers milk volume and to encourage infant to be more efficient at the breast.  Encouraged mother to cue base feed infant and to feed at least 8-12 times in 24 hours.  Mother plans to continue to supplement infant with ebm after breastfeeding.  Mother was given a harmony hand pump with instructions.  Encouraged to post pump after each feeding for 15-20 min.  Mother was offered follow up for weight check with LC . She reports she will phone as needed.  Treatment and prevention of engorgement reviewed. Mother is aware of BFSG'S, OP services and community resources.   Mother is active with WIC. She has an appt in January. [facility].  Mother has paperwork and will page when she is ready for discharge.   Patient Name: Deborah Lozano Reason for consult: Follow-up assessment   Maternal Data    Feeding Feeding Type: Breast Fed  LATCH Score                   Interventions Interventions: Assisted with latch;Skin to skin;Hand express;Reverse pressure;Adjust position;Support pillows;Position options;Hand pump;DEBP  Lactation Tools Discussed/Used Tools: Shells;63F feeding tube / Syringe;Other (comment)(using a curved tip syringe)   Consult Status Consult Status: Complete    Michel BickersKendrick, Careem Yasui McCoy Lozano, 9:35 AM

## 2018-09-21 NOTE — Lactation Note (Signed)
This note was copied from a baby's chart. Lactation Consultation Note:  Mother reports that [facility]. She will use her harmony hand pump. Mother also ask questions about using Promethazine for sleep when she arrives home. Mother was given information from Deborah Lozano, Medications and Mothers Milk , medication is L-3.   Patient Name: Deborah Lozano AOZHY'QToday's Date: 09/21/2018 Reason for consult: Follow-up assessment   Maternal Data    Feeding Feeding Type: Breast Fed  LATCH Score                   Interventions Interventions: Assisted with latch;Skin to skin;Hand express;Reverse pressure;Adjust position;Support pillows;Position options;Hand pump;DEBP  Lactation Tools Discussed/Used Tools: Shells;33F feeding tube / Syringe;Other (comment)(using a curved tip syringe)   Consult Status Consult Status: Complete    Deborah BickersKendrick, Analyssa Downs Lozano 09/21/2018, 10:23 AM

## 2019-07-26 ENCOUNTER — Encounter (HOSPITAL_COMMUNITY): Payer: Self-pay

## 2019-07-26 ENCOUNTER — Emergency Department (HOSPITAL_COMMUNITY)
Admission: EM | Admit: 2019-07-26 | Discharge: 2019-07-26 | Disposition: A | Discharge status: Home / Self Care | Payer: Medicaid Other | Attending: Emergency Medicine | Admitting: Emergency Medicine

## 2019-07-26 ENCOUNTER — Other Ambulatory Visit: Payer: Self-pay

## 2019-07-26 DIAGNOSIS — B86 Scabies: Secondary | ICD-10-CM | POA: Insufficient documentation

## 2019-07-26 DIAGNOSIS — R21 Rash and other nonspecific skin eruption: Secondary | ICD-10-CM

## 2019-07-26 DIAGNOSIS — Z87891 Personal history of nicotine dependence: Secondary | ICD-10-CM | POA: Insufficient documentation

## 2019-07-26 DIAGNOSIS — Z79899 Other long term (current) drug therapy: Secondary | ICD-10-CM | POA: Insufficient documentation

## 2019-07-26 MED ORDER — PERMETHRIN 5 % EX CREA: TOPICAL_CREAM | CUTANEOUS | 1 refills | Status: DC

## 2019-07-26 NOTE — Discharge Instructions (Addendum)
Use Benadryl for itching.  Return here as needed.  Follow-up with your primary doctor.

## 2019-07-26 NOTE — ED Provider Notes (Signed)
[facility]-EMERGENCY DEPT Provider Note   CSN: [id number] Arrival date & time: 07/26/19  1641     History   Chief Complaint Chief Complaint  Patient presents with  . Rash    HPI Deborah Lozano is a 28 y.o. female.     HPI Patient presents to the emergency department with rash that is on both arms and on her hands as well.  The patient states they itch very intensely.  Patient states that they are also on her back and she feels like they have gone to her sides.  The patient states that she slept in her brothers bed and states that that is when she started itching.  The patient states her brother's bedroom is not well kept.  Patient denies any other symptoms.  Patient denies any fever, nausea, vomiting, cough, weakness, dizziness, shortness of breath, throat swelling or syncope. Past Medical History:  Diagnosis Date  . Carpal tunnel syndrome during pregnancy     Patient Active Problem List   Diagnosis Date Noted  . Normal postpartum course 09/20/2018  . Normal labor 09/18/2018  . Vaginal delivery 06/10/2016  . Maternal varicella, non-immune 06/09/2016  . Positive GBS test 06/09/2016  . Obesity 06/09/2016  . Carpal tunnel syndrome during pregnancy 06/09/2016    Past Surgical History:  Procedure Laterality Date  . NO PAST SURGERIES       OB History    Gravida  3   Para  2   Term  2   Preterm      AB  1   Living  2     SAB  1   TAB      Ectopic      Multiple  0   Live Births  2            Home Medications    Prior to Admission medications   Medication Sig Start Date End Date Taking? Authorizing Provider  ibuprofen (ADVIL,MOTRIN) 600 MG tablet Take 1 tablet (600 mg total) by mouth every 6 (six) hours. 09/20/18   Dale DurhamMontana, Jade, FNP  Prenatal Vit-Fe Fumarate-FA (PRENATAL MULTIVITAMIN) TABS tablet Take 1 tablet by mouth daily at 12 noon.    [provider]    Family History Family History  Problem Relation Age of  Onset  . Cancer Paternal Aunt   . Diabetes Paternal Aunt   . Diabetes Maternal Grandfather   . Diabetes Paternal Grandmother   . Alcohol abuse Neg Hx   . Arthritis Neg Hx   . Asthma Neg Hx   . Birth defects Neg Hx   . COPD Neg Hx   . Depression Neg Hx   . Drug abuse Neg Hx   . Early death Neg Hx   . Hearing loss Neg Hx   . Heart disease Neg Hx   . Hyperlipidemia Neg Hx   . Hypertension Neg Hx   . Kidney disease Neg Hx   . Learning disabilities Neg Hx   . Mental illness Neg Hx   . Mental retardation Neg Hx   . Miscarriages / Stillbirths Neg Hx   . Stroke Neg Hx   . Vision loss Neg Hx   . Varicose Veins Neg Hx     Social History Social History   Tobacco Use  . Smoking status: Former Smoker    Quit date: 06/07/2015    Years since quitting: 4.1  . Smokeless tobacco: Never Used  Substance Use Topics  . Alcohol use: No  .  Drug use: No     Allergies   Patient has no known allergies.   Review of Systems Review of Systems All other systems negative except as documented in the HPI. All pertinent positives and negatives as reviewed in the HPI.  Physical Exam Updated Vital Signs BP 119/68 (BP Location: Left Arm)   Pulse 75   Temp 98.7 F (37.1 C) (Oral)   Resp 16   Wt 92 kg   LMP 06/23/2019   SpO2 98%   BMI 33.75 kg/m   Physical Exam Vitals signs and nursing note reviewed.  Constitutional:      General: She is not in acute distress.    Appearance: She is well-developed.  HENT:     Head: Normocephalic and atraumatic.  Eyes:     Pupils: Pupils are equal, round, and reactive to light.  Pulmonary:     Effort: Pulmonary effort is normal.  Skin:    General: Skin is warm and dry.     Findings: Rash present.     Comments: Patient has red rash that are raised on the bilateral arms and several on her right hand and finger area.  Patient has several noted to the upper back as well.  Neurological:     Mental Status: She is alert and oriented to person, place, and  time.      ED Treatments / Results  Labs (all labs ordered are listed, but only abnormal results are displayed) Labs Reviewed - No data to display  EKG None  Radiology No results found.  Procedures Procedures (including critical care time)  Medications Ordered in ED Medications - No data to display   Initial Impression / Assessment and Plan / ED Course  I have reviewed the triage vital signs and the nursing notes.  Pertinent labs & imaging results that were available during my care of the patient were reviewed by me and considered in my medical decision making (see chart for details).        I feel that these are either some sort of mite or dead bug bite.  Have advised the patient to clean all clothing and towels that are associated with where she slept.  Advised patient to take Benadryl for itching.  This does not appear to be an allergic type reaction as they are very scattered in a known particular pattern.  Final Clinical Impressions(s) / ED Diagnoses   Final diagnoses:  None    ED Discharge Orders    None       Dalia Heading, PA-C 07/26/19 [place], [place], DO 07/27/19 1626

## 2019-07-26 NOTE — ED Triage Notes (Signed)
Pt states that she was laying down in bed and felt something biting her. Pt states that she was at her mom's house laying in her brother's bed which was very unkempt. Pt is concerned for scabies.

## 2019-08-08 ENCOUNTER — Other Ambulatory Visit: Payer: Self-pay

## 2019-08-08 ENCOUNTER — Ambulatory Visit
Admission: EM | Admit: 2019-08-08 | Discharge: 2019-08-08 | Disposition: A | Discharge status: Home / Self Care | Payer: Medicaid Other | Attending: Physician Assistant | Admitting: Physician Assistant

## 2019-08-08 DIAGNOSIS — R21 Rash and other nonspecific skin eruption: Secondary | ICD-10-CM | POA: Diagnosis not present

## 2019-08-08 MED ORDER — PREDNISONE 50 MG PO TABS: 50.0000 mg | ORAL_TABLET | Freq: Every day | ORAL | 0 refills | Status: DC

## 2019-08-08 MED ORDER — CETIRIZINE HCL 10 MG PO TBDP: 10.0000 mg | ORAL_TABLET | Freq: Every day | ORAL | 0 refills | Status: DC

## 2019-08-08 NOTE — Discharge Instructions (Addendum)
Start zyrtec as directed. As discussed, if symptoms not improving, can start prednisone, but will need to avoid breastfeeding while on prednisone. Continue to monitor for any insects in the house. Follow up with PCP if symptoms not improving.

## 2019-08-08 NOTE — ED Triage Notes (Signed)
Pt presents to UC stating she is being treated for mites. Pt's left eyelid has been swollen since last night and has been itchy. Denies pain. Pt reports using cream and eye drops which helped slightly. Pt reports some blurriness in vision in left eye but states it may be from swelling.

## 2019-08-08 NOTE — ED Provider Notes (Signed)
[facility]    CSN: [id number] Arrival date & time: 08/08/19  1214      History   Chief Complaint No chief complaint on file.   HPI Deborah Lozano is a 28 y.o. female.   28 year old female comes in for few day history of left eyelid swelling.  States for the past 2 to 3 weeks, has had rashes/insect bites to the body.  She was seen 07/26/2019 at the ED and was treated for scabies with permethrin.  States has also been checking her bed for bedbugs without any obvious evidence.  She has cleaned her house/linens, but still experiencing new rashes.  States felt left eye itching, and has been rubbing eye prior to swelling occurring.  She denies any eye pain.  Denies photophobia, discharge, eye redness.  States can have blurry vision to the left eye intermittently.  Currently breast-feeding 59-month-old. Patient has 2 kids who stay on the same bed without similar symptoms.      Past Medical History:  Diagnosis Date  . Carpal tunnel syndrome during pregnancy     Patient Active Problem List   Diagnosis Date Noted  . Normal postpartum course 09/20/2018  . Normal labor 09/18/2018  . Vaginal delivery 06/10/2016  . Maternal varicella, non-immune 06/09/2016  . Positive GBS test 06/09/2016  . Obesity 06/09/2016  . Carpal tunnel syndrome during pregnancy 06/09/2016    Past Surgical History:  Procedure Laterality Date  . NO PAST SURGERIES      OB History    Gravida  3   Para  2   Term  2   Preterm      AB  1   Living  2     SAB  1   TAB      Ectopic      Multiple  0   Live Births  2            Home Medications    Prior to Admission medications   Medication Sig Start Date End Date Taking? Authorizing Provider  Cetirizine HCl 10 MG TBDP Take 10 mg by mouth daily. 08/08/19   Tasia Catchings, Unity Luepke V, PA-C  ibuprofen (ADVIL,MOTRIN) 600 MG tablet Take 1 tablet (600 mg total) by mouth every 6 (six) hours. 09/20/18   [place], Luvenia Starch, FNP  permethrin (ELIMITE) 5 %  cream Apply head to toe avoiding the eyes and genital region.  Wash off after 8 hours.  Repeat in 2 weeks. 07/26/19   Lawyer, Harrell Gave, PA-C  predniSONE (DELTASONE) 50 MG tablet Take 1 tablet (50 mg total) by mouth daily with breakfast. 08/08/19   Ok Edwards, PA-C    Family History Family History  Problem Relation Age of Onset  . Cancer Paternal Aunt   . Diabetes Paternal Aunt   . Diabetes Maternal Grandfather   . Diabetes Paternal Grandmother   . Alcohol abuse Neg Hx   . Arthritis Neg Hx   . Asthma Neg Hx   . Birth defects Neg Hx   . COPD Neg Hx   . Depression Neg Hx   . Drug abuse Neg Hx   . Early death Neg Hx   . Hearing loss Neg Hx   . Heart disease Neg Hx   . Hyperlipidemia Neg Hx   . Hypertension Neg Hx   . Kidney disease Neg Hx   . Learning disabilities Neg Hx   . Mental illness Neg Hx   . Mental retardation Neg Hx   . Miscarriages /  Stillbirths Neg Hx   . Stroke Neg Hx   . Vision loss Neg Hx   . Varicose Veins Neg Hx     Social History Social History   Tobacco Use  . Smoking status: Former Smoker    Quit date: 06/07/2015    Years since quitting: 4.1  . Smokeless tobacco: Never Used  Substance Use Topics  . Alcohol use: Yes    Comment: 2 glassess of wine a week  . Drug use: No     Allergies   Patient has no known allergies.   Review of Systems Review of Systems  Reason unable to perform ROS: See HPI as above.     Physical Exam Triage Vital Signs ED Triage Vitals  Enc Vitals Group     BP 08/08/19 1225 118/75     Pulse Rate 08/08/19 1225 72     Resp 08/08/19 1225 16     Temp 08/08/19 1225 98 F (36.7 C)     Temp Source 08/08/19 1225 Oral     SpO2 08/08/19 1225 98 %     Weight --      Height --      Head Circumference --      Peak Flow --      Pain Score 08/08/19 1247 0     Pain Loc --      Pain Edu? --      Excl. in GC? --    No data found.  Updated Vital Signs BP 118/75 (BP Location: Right Arm)   Pulse 72   Temp 98 F (36.7 C)  (Oral)   Resp 16   LMP 08/05/2019   SpO2 98%   Visual Acuity Right Eye Distance: 20/30 Left Eye Distance: 20/30 Bilateral Distance: 20/30  Right Eye Near:   Left Eye Near:    Bilateral Near:     Physical Exam Constitutional:      General: She is not in acute distress.    Appearance: She is well-developed. She is not diaphoretic.  HENT:     Head: Normocephalic and atraumatic.  Eyes:     Conjunctiva/sclera: Conjunctivae normal.     Pupils: Pupils are equal, round, and reactive to light.  Pulmonary:     Effort: Pulmonary effort is normal. No respiratory distress.  Skin:    General: Skin is warm and dry.     Comments: Few maculopapular rash to the upper and lower extremity. Scratch marks seen. No pain, no warmth.   Neurological:     Mental Status: She is alert and oriented to person, place, and time.      UC Treatments / Results  Labs (all labs ordered are listed, but only abnormal results are displayed) Labs Reviewed - No data to display  EKG   Radiology No results found.  Procedures Procedures (including critical care time)  Medications Ordered in UC Medications - No data to display  Initial Impression / Assessment and Plan / UC Course  I have reviewed the triage vital signs and the nursing notes.  Pertinent labs & imaging results that were available during my care of the patient were reviewed by me and considered in my medical decision making (see chart for details).    Will treat with zyrtec at this time.  Discussed Zyrtec is present in breast milk, and may see irritability day of her child.  Patient states she had been planning to wean child off breast-feeding, if so, can fill prednisone to help with symptoms if still  not relieved.  Return precautions given.  Patient expresses understanding and agrees to plan.  Final Clinical Impressions(s) / UC Diagnoses   Final diagnoses:  Rash   ED Prescriptions    Medication Sig Dispense Auth. Provider    Cetirizine HCl 10 MG TBDP Take 10 mg by mouth daily. 30 tablet Chales Pelissier V, PA-C   predniSONE (DELTASONE) 50 MG tablet Take 1 tablet (50 mg total) by mouth daily with breakfast. 5 tablet Belinda Fisher, PA-C     PDMP not reviewed this encounter.   Belinda Fisher, PA-C 08/08/19 1352

## 2019-08-18 ENCOUNTER — Ambulatory Visit (INDEPENDENT_AMBULATORY_CARE_PROVIDER_SITE_OTHER): Payer: Medicaid Other | Admitting: Registered Nurse

## 2019-08-18 ENCOUNTER — Encounter: Payer: Self-pay | Admitting: Registered Nurse

## 2019-08-18 ENCOUNTER — Other Ambulatory Visit: Payer: Self-pay

## 2019-08-18 VITALS — BP 107/67 | HR 74 | Temp 98.5°F | Wt 183.0 lb

## 2019-08-18 DIAGNOSIS — L239 Allergic contact dermatitis, unspecified cause: Secondary | ICD-10-CM | POA: Diagnosis not present

## 2019-08-18 NOTE — Progress Notes (Signed)
Established Patient Office Visit  Subjective:  Patient ID: Deborah Lozano, female    DOB: October 08, 1991  Age: 28 y.o. MRN: [id number]  CC:  Chief Complaint  Patient presents with  . Eye Problem    Left eye swollen for about 1 week    HPI Deborah Lozano presents for visit to establish care.   Major complaint was regarding her eye swelling for around 1 week - she was seen in urgent care prior to today's visit, where it was suspected that she may have had scabies, was given permethrin cream, applied it twice, and noted no relief. She notes that she had disseminated lesions across her feet, lower legs, and lower arms. No noted lesions on groin or armpits. No trails noted.  She was also given cetirizine 10mg  PO qd and prednisone 50mg  PO qam for five days. The prednisone brought relief, but she is still concerned for what she may be allergic to and how to get relief - she is interested in a referral to allergy for further workup. Otherwise, healthy, no complaints. Sees gyn regularly.   Past Medical History:  Diagnosis Date  . Carpal tunnel syndrome during pregnancy     Past Surgical History:  Procedure Laterality Date  . NO PAST SURGERIES      Family History  Problem Relation Age of Onset  . Cancer Paternal Aunt   . Diabetes Paternal Aunt   . Diabetes Maternal Grandfather   . Diabetes Paternal Grandmother   . Alcohol abuse Neg Hx   . Arthritis Neg Hx   . Asthma Neg Hx   . Birth defects Neg Hx   . COPD Neg Hx   . Depression Neg Hx   . Drug abuse Neg Hx   . Early death Neg Hx   . Hearing loss Neg Hx   . Heart disease Neg Hx   . Hyperlipidemia Neg Hx   . Hypertension Neg Hx   . Kidney disease Neg Hx   . Learning disabilities Neg Hx   . Mental illness Neg Hx   . Mental retardation Neg Hx   . Miscarriages / Stillbirths Neg Hx   . Stroke Neg Hx   . Vision loss Neg Hx   . Varicose Veins Neg Hx     Social History   Socioeconomic History  . Marital status: Single   Spouse name: Not on file  . Number of children: 2  . Years of education: Not on file  . Highest education level: Not on file  Occupational History  . Not on file  Social Needs  . Financial resource strain: Not hard at all  . Food insecurity    Worry: Never true    Inability: Never true  . Transportation needs    Medical: No    Non-medical: No  Tobacco Use  . Smoking status: Former Smoker    Quit date: 06/07/2015    Years since quitting: 4.2  . Smokeless tobacco: Never Used  Substance and Sexual Activity  . Alcohol use: Yes    Comment: 2 glassess of wine a week  . Drug use: No  . Sexual activity: Yes    Comment: last IC 09/17/18  Lifestyle  . Physical activity    Days per week: 5 days    Minutes per session: 60 min  . Stress: Only a little  Relationships  . Social Musicianconnections    Talks on phone: Three times a week    Gets together: Twice a week  Attends religious service: Patient refused    Active member of club or organization: Patient refused    Attends meetings of clubs or organizations: Patient refused    Relationship status: Never married  . Intimate partner violence    Fear of current or ex partner: No    Emotionally abused: No    Physically abused: No    Forced sexual activity: No  Other Topics Concern  . Not on file  Social History Narrative  . Not on file    Outpatient Medications Prior to Visit  Medication Sig Dispense Refill  . Cetirizine HCl 10 MG TBDP Take 10 mg by mouth daily. 30 tablet 0  . ibuprofen (ADVIL,MOTRIN) 600 MG tablet Take 1 tablet (600 mg total) by mouth every 6 (six) hours. 30 tablet 0  . permethrin (ELIMITE) 5 % cream Apply head to toe avoiding the eyes and genital region.  Wash off after 8 hours.  Repeat in 2 weeks. 30 g 1  . predniSONE (DELTASONE) 50 MG tablet Take 1 tablet (50 mg total) by mouth daily with breakfast. 5 tablet 0   No facility-administered medications prior to visit.     No Known Allergies  ROS Review of  Systems  Constitutional: Negative.   HENT: Negative.   Eyes: Negative.   Respiratory: Negative.   Cardiovascular: Negative.   Gastrointestinal: Negative.   Endocrine: Negative.   Genitourinary: Negative.   Musculoskeletal: Negative.   Skin: Positive for rash. Negative for color change, pallor and wound.  Allergic/Immunologic: Negative.   Neurological: Negative.   Hematological: Negative.   Psychiatric/Behavioral: Negative.   All other systems reviewed and are negative.     Objective:    Physical Exam  Constitutional: She is oriented to person, place, and time. She appears well-developed and well-nourished. No distress.  Cardiovascular: Normal rate and regular rhythm.  Pulmonary/Chest: Effort normal. No respiratory distress.  Neurological: She is alert and oriented to person, place, and time.  Skin: Skin is warm and dry. Rash noted. She is not diaphoretic. No erythema. No pallor.  Disseminated hyperpigmented lesions across lower arms and lower legs. Appears to be allergic dermatitis? But unsure of nonspecific skin eruption. Given the immediate relief with prednisone burst, feel that further workup by allergy is warranted.   Psychiatric: She has a normal mood and affect. Her behavior is normal. Judgment and thought content normal.  Nursing note and vitals reviewed.   BP 107/67 (BP Location: Right Arm, Patient Position: Sitting, Cuff Size: Normal)   Pulse 74   Temp 98.5 F (36.9 C)   Wt 183 lb (83 kg)   LMP 08/05/2019   SpO2 98%   BMI 30.45 kg/m  Wt Readings from Last 3 Encounters:  08/18/19 183 lb (83 kg)  07/26/19 202 lb 13.2 oz (92 kg)  09/18/18 203 lb (92.1 kg)     Health Maintenance Due  Topic Date Due  . PAP-Cervical Cytology Screening  06/17/2014  . INFLUENZA VACCINE  06/07/2019    There are no preventive care reminders to display for this patient.  No results found for: TSH Lab Results  Component Value Date   WBC 8.3 09/20/2018   HGB 11.5 (L)  09/20/2018   HCT 35.0 (L) 09/20/2018   MCV 91.9 09/20/2018   PLT 146 (L) 09/20/2018   Lab Results  Component Value Date   NA 134 (L) 05/21/2018   K 3.6 05/21/2018   CO2 21 (L) 05/21/2018   GLUCOSE 90 05/21/2018   BUN 10 05/21/2018  CREATININE 0.47 05/21/2018   BILITOT 0.8 05/21/2018   ALKPHOS 45 05/21/2018   AST 21 05/21/2018   ALT 20 05/21/2018   PROT 7.3 05/21/2018   ALBUMIN 3.5 05/21/2018   CALCIUM 8.6 (L) 05/21/2018   ANIONGAP 11 05/21/2018   No results found for: CHOL No results found for: HDL No results found for: LDLCALC No results found for: TRIG No results found for: CHOLHDL No results found for: HGBA1C    Assessment & Plan:   Problem List Items Addressed This Visit    None    Visit Diagnoses    Allergic dermatitis    -  Primary   Relevant Orders   Ambulatory referral to Allergy      No orders of the defined types were placed in this encounter.   Follow-up: No follow-ups on file.   PLAN  Refer to allergy  Return to clinic in early 2021 for CPE and labs  Patient encouraged to call clinic with any questions, comments, or concerns.    Maximiano Coss, NP

## 2019-08-25 ENCOUNTER — Other Ambulatory Visit: Payer: Self-pay | Admitting: Obstetrics and Gynecology

## 2019-08-25 NOTE — Telephone Encounter (Signed)
Medication Refill - Medication: predniSONE (DELTASONE) 50 MG tablet     Preferred Pharmacy (with phone number or street name):  [facility] ([address]), [place] - [address]  [address] [place] ([place]) [place] [address]  Phone: [phone] Fax: [phone]     Agent: Please be advised that RX refills may take up to 3 business days. We ask that you follow-up with your pharmacy.

## 2019-08-27 ENCOUNTER — Other Ambulatory Visit: Payer: Self-pay | Admitting: Registered Nurse

## 2019-08-27 ENCOUNTER — Telehealth: Payer: Self-pay | Admitting: Registered Nurse

## 2019-08-27 DIAGNOSIS — L239 Allergic contact dermatitis, unspecified cause: Secondary | ICD-10-CM

## 2019-08-27 MED ORDER — BETAMETHASONE VALERATE 0.1 % EX OINT: 1.0000 "application " | TOPICAL_OINTMENT | Freq: Two times a day (BID) | CUTANEOUS | 2 refills | Status: DC

## 2019-08-27 NOTE — Telephone Encounter (Signed)
Copied from [place] [phone] [phone]. Topic: General - Other >> Aug 27, 2019 11:12 AM Rainey Pines A wrote: Patient would like a callback from nurse in regards to medication. Patient stated that she was under the assumption during her last visit that her medication would be sent to pharmacy.

## 2019-08-27 NOTE — Telephone Encounter (Signed)
Called pt and informed her a Rx was sent to her pharmacy.

## 2019-09-08 ENCOUNTER — Telehealth: Payer: Self-pay | Admitting: Registered Nurse

## 2019-09-08 NOTE — Telephone Encounter (Signed)
Pt wants to know if she can just have labs ordered and not complete a physical. Last seen on 10/12. Please advise.

## 2019-09-09 NOTE — Telephone Encounter (Signed)
Spoke with pt and informed her that she could get her labs at her physical on 09/17/2019.

## 2019-09-16 ENCOUNTER — Ambulatory Visit: Payer: Medicaid Other | Admitting: Allergy and Immunology

## 2019-09-17 ENCOUNTER — Ambulatory Visit (INDEPENDENT_AMBULATORY_CARE_PROVIDER_SITE_OTHER): Payer: Medicaid Other | Admitting: Registered Nurse

## 2019-09-17 ENCOUNTER — Other Ambulatory Visit: Payer: Self-pay

## 2019-09-17 ENCOUNTER — Encounter: Payer: Self-pay | Admitting: Registered Nurse

## 2019-09-17 VITALS — BP 119/70 | HR 61 | Temp 98.0°F | Resp 16 | Ht 65.55 in | Wt 176.0 lb

## 2019-09-17 DIAGNOSIS — Z1329 Encounter for screening for other suspected endocrine disorder: Secondary | ICD-10-CM

## 2019-09-17 DIAGNOSIS — Z13220 Encounter for screening for lipoid disorders: Secondary | ICD-10-CM

## 2019-09-17 DIAGNOSIS — Z0000 Encounter for general adult medical examination without abnormal findings: Secondary | ICD-10-CM | POA: Diagnosis not present

## 2019-09-17 DIAGNOSIS — Z30016 Encounter for initial prescription of transdermal patch hormonal contraceptive device: Secondary | ICD-10-CM

## 2019-09-17 DIAGNOSIS — Z13228 Encounter for screening for other metabolic disorders: Secondary | ICD-10-CM

## 2019-09-17 DIAGNOSIS — F411 Generalized anxiety disorder: Secondary | ICD-10-CM

## 2019-09-17 DIAGNOSIS — R350 Frequency of micturition: Secondary | ICD-10-CM | POA: Diagnosis not present

## 2019-09-17 DIAGNOSIS — Z130 Encounter for screening for diseases of the blood and blood-forming organs and certain disorders involving the immune mechanism: Secondary | ICD-10-CM

## 2019-09-17 DIAGNOSIS — B351 Tinea unguium: Secondary | ICD-10-CM

## 2019-09-17 LAB — POCT URINALYSIS DIP (MANUAL ENTRY)
Bilirubin, UA: NEGATIVE
Glucose, UA: NEGATIVE mg/dL
Ketones, POC UA: NEGATIVE mg/dL
Leukocytes, UA: NEGATIVE
Nitrite, UA: NEGATIVE
Protein Ur, POC: NEGATIVE mg/dL
Spec Grav, UA: 1.025 (ref 1.010–1.025)
Urobilinogen, UA: 0.2 E.U./dL
pH, UA: 6 (ref 5.0–8.0)

## 2019-09-17 LAB — POCT URINE PREGNANCY: Preg Test, Ur: NEGATIVE

## 2019-09-17 MED ORDER — TERBINAFINE HCL 250 MG PO TABS: 250.0000 mg | ORAL_TABLET | Freq: Every day | ORAL | 0 refills | Status: DC

## 2019-09-17 MED ORDER — NORELGESTROMIN-ETH ESTRADIOL 150-35 MCG/24HR TD PTWK: 1.0000 | MEDICATED_PATCH | TRANSDERMAL | 3 refills | Status: DC

## 2019-09-17 NOTE — Progress Notes (Signed)
Established Patient Office Visit  Subjective:  Patient ID: Deborah Lozano, female    DOB: 1990/11/21  Age: 28 y.o. MRN: [id number]  CC:  Chief Complaint  Patient presents with  . Annual Exam    HPI Deborah Lozano presents for CPE  States she has anxiety and would like a referral to a counselor.  Notes on ROS that she had an abnormal menses 2 weeks ago with light bleeding that lasted 5 days. In addition, pain with intercourse, nausea, and anxiety - she suspects these may be related  Otherwise feels well. Interested in starting on [facility] today. Last [facility]IC was mid October, none since. Has taken 3 home pregnancy tests, all negative. Will run urine preg test in office today.  Past Medical History:  Diagnosis Date  . Carpal tunnel syndrome during pregnancy     Past Surgical History:  Procedure Laterality Date  . NO PAST SURGERIES      Family History  Problem Relation Age of Onset  . Cancer Paternal Aunt   . Diabetes Paternal Aunt   . Diabetes Maternal Grandfather   . Diabetes Paternal Grandmother   . Alcohol abuse Neg Hx   . Arthritis Neg Hx   . Asthma Neg Hx   . Birth defects Neg Hx   . COPD Neg Hx   . Depression Neg Hx   . Drug abuse Neg Hx   . Early death Neg Hx   . Hearing loss Neg Hx   . Heart disease Neg Hx   . Hyperlipidemia Neg Hx   . Hypertension Neg Hx   . Kidney disease Neg Hx   . Learning disabilities Neg Hx   . Mental illness Neg Hx   . Mental retardation Neg Hx   . Miscarriages / Stillbirths Neg Hx   . Stroke Neg Hx   . Vision loss Neg Hx   . Varicose Veins Neg Hx     Social History   Socioeconomic History  . Marital status: Single    Spouse name: Not on file  . Number of children: 2  . Years of education: Not on file  . Highest education level: Not on file  Occupational History  . Not on file  Social Needs  . Financial resource strain: Not hard at all  . Food insecurity    Worry: Never true    Inability: Never true  . Transportation  needs    Medical: No    Non-medical: No  Tobacco Use  . Smoking status: Former Smoker    Quit date: 06/07/2015    Years since quitting: 4.2  . Smokeless tobacco: Never Used  Substance and Sexual Activity  . Alcohol use: Yes    Comment: 2 glassess of wine a week  . Drug use: No  . Sexual activity: Yes    Comment: last IC 09/17/18  Lifestyle  . Physical activity    Days per week: 5 days    Minutes per session: 60 min  . Stress: Only a little  Relationships  . Social Musician on phone: Three times a week    Gets together: Twice a week    Attends religious service: Patient refused    Active member of club or organization: Patient refused    Attends meetings of clubs or organizations: Patient refused    Relationship status: Never married  . Intimate partner violence    Fear of current or ex partner: No    Emotionally abused: No  Physically abused: No    Forced sexual activity: No  Other Topics Concern  . Not on file  Social History Narrative  . Not on file    Outpatient Medications Prior to Visit  Medication Sig Dispense Refill  . betamethasone valerate ointment (VALISONE) 0.1 % Apply 1 application topically 2 (two) times daily. 30 g 2  . Cetirizine HCl 10 MG TBDP Take 10 mg by mouth daily. 30 tablet 0  . ibuprofen (ADVIL,MOTRIN) 600 MG tablet Take 1 tablet (600 mg total) by mouth every 6 (six) hours. 30 tablet 0  . permethrin (ELIMITE) 5 % cream Apply head to toe avoiding the eyes and genital region.  Wash off after 8 hours.  Repeat in 2 weeks. 30 g 1  . predniSONE (DELTASONE) 50 MG tablet Take 1 tablet (50 mg total) by mouth daily with breakfast. 5 tablet 0   No facility-administered medications prior to visit.     No Known Allergies  ROS Review of Systems  Constitutional: Negative.   HENT: Negative.   Eyes: Negative.   Respiratory: Negative.   Cardiovascular: Negative.   Gastrointestinal: Positive for nausea. Negative for blood in stool, diarrhea  and vomiting.  Endocrine: Negative.   Genitourinary: Positive for dyspareunia, menstrual problem and vaginal pain. Negative for decreased urine volume, difficulty urinating, dysuria, enuresis, flank pain, frequency, genital sores, hematuria, pelvic pain, urgency, vaginal bleeding and vaginal discharge.  Musculoskeletal: Negative.   Skin: Negative.   Allergic/Immunologic: Negative.   Neurological: Negative.   Hematological: Negative.   Psychiatric/Behavioral: Negative.   All other systems reviewed and are negative.     Objective:    Physical Exam  Constitutional: She is oriented to person, place, and time. She appears well-developed and well-nourished. No distress.  HENT:  Head: Normocephalic and atraumatic.  Right Ear: External ear normal.  Left Ear: External ear normal.  Nose: Nose normal.  Mouth/Throat: Oropharynx is clear and moist. No oropharyngeal exudate.  Eyes: Pupils are equal, round, and reactive to light. Conjunctivae and EOM are normal. Right eye exhibits no discharge. Left eye exhibits no discharge. No scleral icterus.  Neck: Normal range of motion. Neck supple. No tracheal deviation present. No thyromegaly present.  Cardiovascular: Normal rate, regular rhythm, normal heart sounds and intact distal pulses. Exam reveals no gallop and no friction rub.  No murmur heard. Pulmonary/Chest: Effort normal and breath sounds normal. No respiratory distress. She has no wheezes. She has no rales. She exhibits no tenderness.  Abdominal: Soft. Bowel sounds are normal. She exhibits no distension and no mass. There is no abdominal tenderness. There is no rebound and no guarding.  Musculoskeletal: Normal range of motion.        General: No tenderness, deformity or edema.  Lymphadenopathy:    She has no cervical adenopathy.  Neurological: She is alert and oriented to person, place, and time. No cranial nerve deficit. She exhibits normal muscle tone. Coordination normal.  Skin: Skin is  warm and dry. No rash noted. She is not diaphoretic. No erythema. No pallor.  Noted onychomycosis on L foot. Largely on great toe, but question of some involvement across all nails.  Psychiatric: She has a normal mood and affect. Her behavior is normal. Judgment and thought content normal.  Nursing note and vitals reviewed.   BP 119/70   Pulse 61   Temp 98 F (36.7 C) (Oral)   Resp 16   Ht 5' 5.55" (1.665 m)   Wt 176 lb (79.8 kg)   LMP 08/05/2019  SpO2 100%   BMI 28.80 kg/m  Wt Readings from Last 3 Encounters:  09/17/19 176 lb (79.8 kg)  08/18/19 183 lb (83 kg)  07/26/19 202 lb 13.2 oz (92 kg)     There are no preventive care reminders to display for this patient.  There are no preventive care reminders to display for this patient.  No results found for: TSH Lab Results  Component Value Date   WBC 8.3 09/20/2018   HGB 11.5 (L) 09/20/2018   HCT 35.0 (L) 09/20/2018   MCV 91.9 09/20/2018   PLT 146 (L) 09/20/2018   Lab Results  Component Value Date   NA 134 (L) 05/21/2018   K 3.6 05/21/2018   CO2 21 (L) 05/21/2018   GLUCOSE 90 05/21/2018   BUN 10 05/21/2018   CREATININE 0.47 05/21/2018   BILITOT 0.8 05/21/2018   ALKPHOS 45 05/21/2018   AST 21 05/21/2018   ALT 20 05/21/2018   PROT 7.3 05/21/2018   ALBUMIN 3.5 05/21/2018   CALCIUM 8.6 (L) 05/21/2018   ANIONGAP 11 05/21/2018   No results found for: CHOL No results found for: HDL No results found for: LDLCALC No results found for: TRIG No results found for: CHOLHDL No results found for: HGBA1C    Assessment & Plan:   Problem List Items Addressed This Visit    None    Visit Diagnoses    Annual physical exam    -  Primary   Screening for endocrine, metabolic and immunity disorder       Relevant Orders   CBC with Differential   Comprehensive metabolic panel   Hemoglobin A1c   TSH   Lipid screening       Relevant Orders   Lipid panel   Urinary frequency       Relevant Orders   POCT urine pregnancy  (Completed)   POCT urinalysis dipstick (Completed)   Anxiety state       Relevant Orders   Ambulatory referral to Psychology   Onychomycosis       Relevant Medications   terbinafine (LAMISIL) 250 MG tablet   Encounter for initial prescription of transdermal patch hormonal contraceptive device       Relevant Medications   norelgestromin-ethinyl estradiol (ORTHO EVRA) 150-35 MCG/24HR transdermal patch      Meds ordered this encounter  Medications  . terbinafine (LAMISIL) 250 MG tablet    Sig: Take 1 tablet (250 mg total) by mouth daily.    Dispense:  84 tablet    Refill:  0    Order Specific Question:   Supervising Provider    Answer:   Delia Chimes A [id number]  . norelgestromin-ethinyl estradiol (ORTHO EVRA) 150-35 MCG/24HR transdermal patch    Sig: Place 1 patch onto the skin once a week. For 3 weeks, then 1 week without patch    Dispense:  9 patch    Refill:  3    Order Specific Question:   Supervising Provider    Answer:   Forrest Moron [id number]    Follow-up: Return in about 1 year (around 09/16/2020).   Plan  Terbinafine 250mg  PO qd for 12 weeks for onychomycosis.  Start Ortho Evra patch at convenience - no [facility]IC between now and start, use protection or abstinence for first week of patches. Urine preg test in office negative.  Referral to psychology for counseling  Besides above noted, normal exam.   Labs drawn, will follow up as warranted  Pap done with Donnel Saxon, CNM. Normal  Patient encouraged to call clinic with any questions, comments, or concerns.   Janeece Agee, NP

## 2019-09-17 NOTE — Patient Instructions (Addendum)
   If you have lab work done today you will be contacted with your lab results within the next 2 weeks.  If you have not heard from us then please contact us. The fastest way to get your results is to register for My Chart.   IF you received an x-ray today, you will receive an invoice from [facility]. Please contact  Radiology at [phone] with questions or concerns regarding your invoice.   IF you received labwork today, you will receive an invoice from [facility]. Please contact [facility] at [phone] with questions or concerns regarding your invoice.   Our billing staff will not be able to assist you with questions regarding bills from these companies.  You will be contacted with the lab results as soon as they are available. The fastest way to get your results is to activate your My Chart account. Instructions are located on the last page of this paperwork. If you have not heard from us regarding the results in 2 weeks, please contact this office.       Health Maintenance, Female Adopting a healthy lifestyle and getting preventive care are important in promoting health and wellness. Ask your health care provider about:  The right schedule for you to have regular tests and exams.  Things you can do on your own to prevent diseases and keep yourself healthy. What should I know about diet, weight, and exercise? Eat a healthy diet   Eat a diet that includes plenty of vegetables, fruits, low-fat dairy products, and lean protein.  Do not eat a lot of foods that are high in solid fats, added sugars, or sodium. Maintain a healthy weight Body mass index (BMI) is used to identify weight problems. It estimates body fat based on height and weight. Your health care provider can help determine your BMI and help you achieve or maintain a healthy weight. Get regular exercise Get regular exercise. This is one of the most important things you can do for your health. Most  adults should:  Exercise for at least 150 minutes each week. The exercise should increase your heart rate and make you sweat (moderate-intensity exercise).  Do strengthening exercises at least twice a week. This is in addition to the moderate-intensity exercise.  Spend less time sitting. Even light physical activity can be beneficial. Watch cholesterol and blood lipids Have your blood tested for lipids and cholesterol at 26 years of age, then have this test every 5 years. Have your cholesterol levels checked more often if:  Your lipid or cholesterol levels are high.  You are older than 26 years of age.  You are at high risk for heart disease. What should I know about cancer screening? Depending on your health history and family history, you may need to have cancer screening at various ages. This may include screening for:  Breast cancer.  Cervical cancer.  Colorectal cancer.  Skin cancer.  Lung cancer. What should I know about heart disease, diabetes, and high blood pressure? Blood pressure and heart disease  High blood pressure causes heart disease and increases the risk of stroke. This is more likely to develop in people who have high blood pressure readings, are of African descent, or are overweight.  Have your blood pressure checked: ? Every 3-5 years if you are 18-39 years of age. ? Every year if you are 40 years old or older. Diabetes Have regular diabetes screenings. This checks your fasting blood sugar level. Have the screening done:  Once   every three years after age 40 if you are at a normal weight and have a low risk for diabetes.  More often and at a younger age if you are overweight or have a high risk for diabetes. What should I know about preventing infection? Hepatitis B If you have a higher risk for hepatitis B, you should be screened for this virus. Talk with your health care provider to find out if you are at risk for hepatitis B infection. Hepatitis  C Testing is recommended for:  Everyone born from 1945 through 1965.  Anyone with known risk factors for hepatitis C. Sexually transmitted infections (STIs)  Get screened for STIs, including gonorrhea and chlamydia, if: ? You are sexually active and are younger than 26 years of age. ? You are older than 26 years of age and your health care provider tells you that you are at risk for this type of infection. ? Your sexual activity has changed since you were last screened, and you are at increased risk for chlamydia or gonorrhea. Ask your health care provider if you are at risk.  Ask your health care provider about whether you are at high risk for HIV. Your health care provider may recommend a prescription medicine to help prevent HIV infection. If you choose to take medicine to prevent HIV, you should first get tested for HIV. You should then be tested every 3 months for as long as you are taking the medicine. Pregnancy  If you are about to stop having your period (premenopausal) and you may become pregnant, seek counseling before you get pregnant.  Take 400 to 800 micrograms (mcg) of folic acid every day if you become pregnant.  Ask for birth control (contraception) if you want to prevent pregnancy. Osteoporosis and menopause Osteoporosis is a disease in which the bones lose minerals and strength with aging. This can result in bone fractures. If you are 65 years old or older, or if you are at risk for osteoporosis and fractures, ask your health care provider if you should:  Be screened for bone loss.  Take a calcium or vitamin D supplement to lower your risk of fractures.  Be given hormone replacement therapy (HRT) to treat symptoms of menopause. Follow these instructions at home: Lifestyle  Do not use any products that contain nicotine or tobacco, such as cigarettes, e-cigarettes, and chewing tobacco. If you need help quitting, ask your health care provider.  Do not use street  drugs.  Do not share needles.  Ask your health care provider for help if you need support or information about quitting drugs. Alcohol use  Do not drink alcohol if: ? Your health care provider tells you not to drink. ? You are pregnant, may be pregnant, or are planning to become pregnant.  If you drink alcohol: ? Limit how much you use to 0-1 drink a day. ? Limit intake if you are breastfeeding.  Be aware of how much alcohol is in your drink. In the [place] one drink equals one 12 oz bottle of beer (355 mL), one 5 oz glass of wine (148 mL), or one 1 oz glass of hard liquor (44 mL). General instructions  Schedule regular health, dental, and eye exams.  Stay current with your vaccines.  Tell your health care provider if: ? You often feel depressed. ? You have ever been abused or do not feel safe at home. Summary  Adopting a healthy lifestyle and getting preventive care are important in promoting health and   wellness.  Follow your health care provider's instructions about healthy diet, exercising, and getting tested or screened for diseases.  Follow your health care provider's instructions on monitoring your cholesterol and blood pressure. This information is not intended to replace advice given to you by your health care provider. Make sure you discuss any questions you have with your health care provider. Document Released: 05/08/2011 Document Revised: 10/16/2018 Document Reviewed: 10/16/2018 Elsevier Patient Education  2020 [employer]     Why follow it? Research shows. . Those who follow the Mediterranean diet have a reduced risk of heart disease  . The diet is associated with a reduced incidence of Parkinson's and Alzheimer's diseases . People following the diet may have longer life expectancies and lower rates of chronic diseases  . The Dietary Guidelines for Americans recommends the Mediterranean diet as an eating plan to promote health and prevent disease  What Is the  Mediterranean Diet?  . Healthy eating plan based on typical foods and recipes of Mediterranean-style cooking . The diet is primarily a plant based diet; these foods should make up a majority of meals   Starches - Plant based foods should make up a majority of meals - They are an important sources of vitamins, minerals, energy, antioxidants, and fiber - Choose whole grains, foods high in fiber and minimally processed items  - Typical grain sources include wheat, oats, barley, corn, brown rice, bulgar, farro, millet, polenta, couscous  - Various types of beans include chickpeas, lentils, fava beans, black beans, white beans   Fruits  Veggies - Large quantities of antioxidant rich fruits & veggies; 6 or more servings  - Vegetables can be eaten raw or lightly drizzled with oil and cooked  - Vegetables common to the traditional Mediterranean Diet include: artichokes, arugula, beets, broccoli, brussel sprouts, cabbage, carrots, celery, collard greens, cucumbers, eggplant, kale, leeks, lemons, lettuce, mushrooms, okra, onions, peas, peppers, potatoes, pumpkin, radishes, rutabaga, shallots, spinach, sweet potatoes, turnips, zucchini - Fruits common to the Mediterranean Diet include: apples, apricots, avocados, cherries, clementines, dates, figs, grapefruits, grapes, melons, nectarines, oranges, peaches, pears, pomegranates, strawberries, tangerines  Fats - Replace butter and margarine with healthy oils, such as olive oil, canola oil, and tahini  - Limit nuts to no more than a handful a day  - Nuts include walnuts, almonds, pecans, pistachios, pine nuts  - Limit or avoid candied, honey roasted or heavily salted nuts - Olives are central to the Mediterranean diet - can be eaten whole or used in a variety of dishes   Meats Protein - Limiting red meat: no more than a few times a month - When eating red meat: choose lean cuts and keep the portion to the size of deck of cards - Eggs: approx. 0 to 4 times a  week  - Fish and lean poultry: at least 2 a week  - Healthy protein sources include, chicken, turkey, lean beef, lamb - Increase intake of seafood such as tuna, salmon, trout, mackerel, shrimp, scallops - Avoid or limit high fat processed meats such as sausage and bacon  Dairy - Include moderate amounts of low fat dairy products  - Focus on healthy dairy such as fat free yogurt, skim milk, low or reduced fat cheese - Limit dairy products higher in fat such as whole or 2% milk, cheese, ice cream  Alcohol - Moderate amounts of red wine is ok  - No more than 5 oz daily for women (all ages) and men older than age 65  -   No more than 10 oz of wine daily for men younger than 65  Other - Limit sweets and other desserts  - Use herbs and spices instead of salt to flavor foods  - Herbs and spices common to the traditional Mediterranean Diet include: basil, bay leaves, chives, cloves, cumin, fennel, garlic, lavender, marjoram, mint, oregano, parsley, pepper, rosemary, sage, savory, sumac, tarragon, thyme   It's not just a diet, it's a lifestyle:  . The Mediterranean diet includes lifestyle factors typical of those in the region  . Foods, drinks and meals are best eaten with others and savored . Daily physical activity is important for overall good health . This could be strenuous exercise like running and aerobics . This could also be more leisurely activities such as walking, housework, yard-work, or taking the stairs . Moderation is the key; a balanced and healthy diet accommodates most foods and drinks . Consider portion sizes and frequency of consumption of certain foods   Meal Ideas & Options:  . Breakfast:  o Whole wheat toast or whole wheat English muffins with peanut butter & hard boiled egg o Steel cut oats topped with apples & cinnamon and skim milk  o Fresh fruit: banana, strawberries, melon, berries, peaches  o Smoothies: strawberries, bananas, greek yogurt, peanut butter o Low fat  greek yogurt with blueberries and granola  o Egg white omelet with spinach and mushrooms o Breakfast couscous: whole wheat couscous, apricots, skim milk, cranberries  . Sandwiches:  o Hummus and grilled vegetables (peppers, zucchini, squash) on whole wheat bread   o Grilled chicken on whole wheat pita with lettuce, tomatoes, cucumbers or tzatziki  o Tuna salad on whole wheat bread: tuna salad made with greek yogurt, olives, red peppers, capers, green onions o Garlic rosemary lamb pita: lamb sauted with garlic, rosemary, salt & pepper; add lettuce, cucumber, greek yogurt to pita - flavor with lemon juice and black pepper  . Seafood:  o Mediterranean grilled salmon, seasoned with garlic, basil, parsley, lemon juice and black pepper o Shrimp, lemon, and spinach whole-grain pasta salad made with low fat greek yogurt  o Seared scallops with lemon orzo  o Seared tuna steaks seasoned salt, pepper, coriander topped with tomato mixture of olives, tomatoes, olive oil, minced garlic, parsley, green onions and cappers  . Meats:  o Herbed greek chicken salad with kalamata olives, cucumber, feta  o Red bell peppers stuffed with spinach, bulgur, lean ground beef (or lentils) & topped with feta   o Kebabs: skewers of chicken, tomatoes, onions, zucchini, squash  o Turkey burgers: made with red onions, mint, dill, lemon juice, feta cheese topped with roasted red peppers . Vegetarian o Cucumber salad: cucumbers, artichoke hearts, celery, red onion, feta cheese, tossed in olive oil & lemon juice  o Hummus and whole grain pita points with a greek salad (lettuce, tomato, feta, olives, cucumbers, red onion) o Lentil soup with celery, carrots made with vegetable broth, garlic, salt and pepper  o Tabouli salad: parsley, bulgur, mint, scallions, cucumbers, tomato, radishes, lemon juice, olive oil, salt and pepper.       Fat and Cholesterol Restricted Eating Plan Eating a diet that limits fat and cholesterol may  help lower your risk for heart disease and other conditions. Your body needs fat and cholesterol for basic functions, but eating too much of these things can be harmful to your health. Your health care provider may order lab tests to check your blood fat (lipid) and cholesterol levels. This helps your   health care provider understand your risk for certain conditions and whether you need to make diet changes. Work with your health care provider or dietitian to make an eating plan that is right for you. Your plan includes:  Limit your fat intake to ______% or less of your total calories a day.  Limit your saturated fat intake to ______% or less of your total calories a day.  Limit the amount of cholesterol in your diet to less than _________mg a day.  Eat ___________ g of fiber a day. What are tips for following this plan? General guidelines   If you are overweight, work with your health care provider to lose weight safely. Losing just 5-10% of your body weight can improve your overall health and help prevent diseases such as diabetes and heart disease.  Avoid: ? Foods with added sugar. ? Fried foods. ? Foods that contain partially hydrogenated oils, including stick margarine, some tub margarines, cookies, crackers, and other baked goods.  Limit alcohol intake to no more than 1 drink a day for nonpregnant women and 2 drinks a day for men. One drink equals 12 oz of beer, 5 oz of wine, or 1 oz of hard liquor. Reading food labels  Check food labels for: ? Trans fats, partially hydrogenated oils, or high amounts of saturated fat. Avoid foods that contain saturated fat and trans fat. ? The amount of cholesterol in each serving. Try to eat no more than 200 mg of cholesterol each day. ? The amount of fiber in each serving. Try to eat at least 20-30 g of fiber each day.  Choose foods with healthy fats, such as: ? Monounsaturated and polyunsaturated fats. These include olive and canola oil,  flaxseeds, walnuts, almonds, and seeds. ? Omega-3 fats. These are found in foods such as salmon, mackerel, sardines, tuna, flaxseed oil, and ground flaxseeds.  Choose grain products that have whole grains. Look for the word "whole" as the first word in the ingredient list. Cooking  Cook foods using methods other than frying. Baking, boiling, grilling, and broiling are some healthy options.  Eat more home-cooked food and less restaurant, buffet, and fast food.  Avoid cooking using saturated fats. ? Animal sources of saturated fats include meats, butter, and cream. ? Plant sources of saturated fats include palm oil, palm kernel oil, and coconut oil. Meal planning   At meals, imagine dividing your plate into fourths: ? Fill one-half of your plate with vegetables and green salads. ? Fill one-fourth of your plate with whole grains. ? Fill one-fourth of your plate with lean protein foods.  Eat fish that is high in omega-3 fats at least two times a week.  Eat more foods that contain fiber, such as whole grains, beans, apples, broccoli, carrots, peas, and barley. These foods help promote healthy cholesterol levels in the blood. Recommended foods Grains  Whole grains, such as whole wheat or whole grain breads, crackers, cereals, and pasta. Unsweetened oatmeal, bulgur, barley, quinoa, or brown rice. Corn or whole wheat flour tortillas. Vegetables  Fresh or frozen vegetables (raw, steamed, roasted, or grilled). Green salads. Fruits  All fresh, canned (in natural juice), or frozen fruits. Meats and other protein foods  Ground beef (85% or leaner), grass-fed beef, or beef trimmed of fat. Skinless chicken or turkey. Ground chicken or turkey. Pork trimmed of fat. All fish and seafood. Egg whites. Dried beans, peas, or lentils. Unsalted nuts or seeds. Unsalted canned beans. Natural nut butters without added sugar and oil. Dairy    Low-fat or nonfat dairy products, such as skim or 1% milk, 2% or  reduced-fat cheeses, low-fat and fat-free ricotta or cottage cheese, or plain low-fat and nonfat yogurt. Fats and oils  Tub margarine without trans fats. Light or reduced-fat mayonnaise and salad dressings. Avocado. Olive, canola, sesame, or safflower oils. The items listed above may not be a complete list of recommended foods or beverages. Contact your dietitian for more options. Foods to avoid Grains  White bread. White pasta. White rice. Cornbread. Bagels, pastries, and croissants. Crackers and snack foods that contain trans fat and hydrogenated oils. Vegetables  Vegetables cooked in cheese, cream, or butter sauce. Fried vegetables. Fruits  Canned fruit in heavy syrup. Fruit in cream or butter sauce. Fried fruit. Meats and other protein foods  Fatty cuts of meat. Ribs, chicken wings, bacon, sausage, bologna, salami, chitterlings, fatback, hot dogs, bratwurst, and packaged lunch meats. Liver and organ meats. Whole eggs and egg yolks. Chicken and turkey with skin. Fried meat. Dairy  Whole or 2% milk, cream, half-and-half, and cream cheese. Whole milk cheeses. Whole-fat or sweetened yogurt. Full-fat cheeses. Nondairy creamers and whipped toppings. Processed cheese, cheese spreads, and cheese curds. Beverages  Alcohol. Sugar-sweetened drinks such as sodas, lemonade, and fruit drinks. Fats and oils  Butter, stick margarine, lard, shortening, ghee, or bacon fat. Coconut, palm kernel, and palm oils. Sweets and desserts  Corn syrup, sugars, honey, and molasses. Candy. Jam and jelly. Syrup. Sweetened cereals. Cookies, pies, cakes, donuts, muffins, and ice cream. The items listed above may not be a complete list of foods and beverages to avoid. Contact your dietitian for more information. Summary  Your body needs fat and cholesterol for basic functions. However, eating too much of these things can be harmful to your health.  Work with your health care provider and dietitian to follow a  diet low in fat and cholesterol. Doing this may help lower your risk for heart disease and other conditions.  Choose healthy fats, such as monounsaturated and polyunsaturated fats, and foods high in omega-3 fatty acids.  Eat fiber-rich foods, such as whole grains, beans, peas, fruits, and vegetables.  Limit or avoid alcohol, fried foods, and foods high in saturated fats, partially hydrogenated oils, and sugar. This information is not intended to replace advice given to you by your health care provider. Make sure you discuss any questions you have with your health care provider. Document Released: 10/23/2005 Document Revised: 10/05/2017 Document Reviewed: 07/10/2017 Elsevier Patient Education  2020 [employer].  American Heart Association (AHA) Exercise Recommendation  Being physically active is important to prevent heart disease and stroke, the nation's No. 1and No. 5killers. To improve overall cardiovascular health, we suggest at least 150 minutes per week of moderate exercise or 75 minutes per week of vigorous exercise (or a combination of moderate and vigorous activity). Thirty minutes a day, five times a week is an easy goal to remember. You will also experience benefits even if you divide your time into two or three segments of 10 to 15 minutes per day.  For people who would benefit from lowering their blood pressure or cholesterol, we recommend 40 minutes of aerobic exercise of moderate to vigorous intensity three to four times a week to lower the risk for heart attack and stroke.  Physical activity is anything that makes you move your body and burn calories.  This includes things like climbing stairs or playing sports. Aerobic exercises benefit your heart, and include walking, jogging, swimming or biking. Strength and   stretching exercises are best for overall stamina and flexibility.  The simplest, positive change you can make to effectively improve your heart health is to start  walking. It's enjoyable, free, easy, social and great exercise. A walking program is flexible and boasts high success rates because people can stick with it. It's easy for walking to become a regular and satisfying part of life.   For Overall Cardiovascular Health:  At least 30 minutes of moderate-intensity aerobic activity at least 5 days per week for a total of 150  OR   At least 25 minutes of vigorous aerobic activity at least 3 days per week for a total of 75 minutes; or a combination of moderate- and vigorous-intensity aerobic activity  AND   Moderate- to high-intensity muscle-strengthening activity at least 2 days per week for additional health benefits.  For Lowering Blood Pressure and Cholesterol  An average 40 minutes of moderate- to vigorous-intensity aerobic activity 3 or 4 times per week  What if I can't make it to the time goal? Something is always better than nothing! And everyone has to start somewhere. Even if you've been sedentary for years, today is the day you can begin to make healthy changes in your life. If you don't think you'll make it for 30 or 40 minutes, set a reachable goal for today. You can work up toward your overall goal by increasing your time as you get stronger. Don't let all-or-nothing thinking rob you of doing what you can every day.  Source:http://www.heart.org    

## 2019-09-18 ENCOUNTER — Encounter: Payer: Self-pay | Admitting: Registered Nurse

## 2019-09-18 LAB — COMPREHENSIVE METABOLIC PANEL
ALT: 24 IU/L (ref 0–32)
AST: 15 IU/L (ref 0–40)
Albumin/Globulin Ratio: 1.8 (ref 1.2–2.2)
Albumin: 4.6 g/dL (ref 3.9–5.0)
Alkaline Phosphatase: 80 IU/L (ref 39–117)
BUN/Creatinine Ratio: 12 (ref 9–23)
BUN: 10 mg/dL (ref 6–20)
Bilirubin Total: 0.5 mg/dL (ref 0.0–1.2)
CO2: 21 mmol/L (ref 20–29)
Calcium: 9.5 mg/dL (ref 8.7–10.2)
Chloride: 104 mmol/L (ref 96–106)
Creatinine, Ser: 0.83 mg/dL (ref 0.57–1.00)
GFR calc Af Amer: 113 mL/min/{1.73_m2} (ref >59)
GFR calc non Af Amer: 98 mL/min/{1.73_m2} (ref >59)
Globulin, Total: 2.6 g/dL (ref 1.5–4.5)
Glucose: 86 mg/dL (ref 65–99)
Potassium: 4.3 mmol/L (ref 3.5–5.2)
Sodium: 140 mmol/L (ref 134–144)
Total Protein: 7.2 g/dL (ref 6.0–8.5)

## 2019-09-18 LAB — LIPID PANEL
Chol/HDL Ratio: 3.1 ratio (ref 0.0–4.4)
Cholesterol, Total: 133 mg/dL (ref 100–199)
HDL: 43 mg/dL (ref >39)
LDL Chol Calc (NIH): 74 mg/dL (ref 0–99)
Triglycerides: 81 mg/dL (ref 0–149)
VLDL Cholesterol Cal: 16 mg/dL (ref 5–40)

## 2019-09-18 LAB — CBC WITH DIFFERENTIAL/PLATELET
Basophils Absolute: 0 10*3/uL (ref 0.0–0.2)
Basos: 0 %
EOS (ABSOLUTE): 0.1 10*3/uL (ref 0.0–0.4)
Eos: 2 %
Hematocrit: 42 % (ref 34.0–46.6)
Hemoglobin: 13.8 g/dL (ref 11.1–15.9)
Immature Grans (Abs): 0 10*3/uL (ref 0.0–0.1)
Immature Granulocytes: 0 %
Lymphocytes Absolute: 2.6 10*3/uL (ref 0.7–3.1)
Lymphs: 41 %
MCH: 30.5 pg (ref 26.6–33.0)
MCHC: 32.9 g/dL (ref 31.5–35.7)
MCV: 93 fL (ref 79–97)
Monocytes Absolute: 0.5 10*3/uL (ref 0.1–0.9)
Monocytes: 8 %
Neutrophils Absolute: 3.2 10*3/uL (ref 1.4–7.0)
Neutrophils: 49 %
Platelets: 225 10*3/uL (ref 150–450)
RBC: 4.53 x10E6/uL (ref 3.77–5.28)
RDW: 12.3 % (ref 11.7–15.4)
WBC: 6.4 10*3/uL (ref 3.4–10.8)

## 2019-09-18 LAB — HEMOGLOBIN A1C
Est. average glucose Bld gHb Est-mCnc: 100 mg/dL
Hgb A1c MFr Bld: 5.1 % (ref 4.8–5.6)

## 2019-09-18 LAB — TSH: TSH: 1.01 u[IU]/mL (ref 0.450–4.500)

## 2019-10-14 ENCOUNTER — Ambulatory Visit: Payer: Medicaid Other | Admitting: Allergy and Immunology

## 2019-10-20 ENCOUNTER — Encounter: Payer: Medicaid Other | Admitting: Registered Nurse

## 2019-12-16 ENCOUNTER — Encounter: Payer: Self-pay | Admitting: Emergency Medicine

## 2019-12-16 ENCOUNTER — Ambulatory Visit
Admission: EM | Admit: 2019-12-16 | Discharge: 2019-12-16 | Disposition: A | Discharge status: Home / Self Care | Payer: Medicaid Other | Attending: Emergency Medicine | Admitting: Emergency Medicine

## 2019-12-16 ENCOUNTER — Other Ambulatory Visit: Payer: Self-pay

## 2019-12-16 DIAGNOSIS — R1030 Lower abdominal pain, unspecified: Secondary | ICD-10-CM | POA: Diagnosis not present

## 2019-12-16 LAB — POCT URINE PREGNANCY: Preg Test, Ur: NEGATIVE

## 2019-12-16 MED ORDER — ONDANSETRON HCL 4 MG PO TABS: 4.0000 mg | ORAL_TABLET | Freq: Four times a day (QID) | ORAL | 0 refills | Status: DC

## 2019-12-16 NOTE — ED Provider Notes (Signed)
[facility]    CSN: [id number] Arrival date & time: 12/16/19  1003      History   Chief Complaint Chief Complaint  Patient presents with  . Abdominal Pain    HPI Deborah Lozano is a 29 y.o. female with history of obesity presenting for lower abdominal pain, nausea, vomiting with diarrhea for the last 3 days.  Patient states that most of her symptoms were Saturday, had improvement Sunday and yesterday.  Last bowel movement was this morning: Normal for her and without blood/melena/pain.  Patient was having 2 bowel movements daily previous to that.  Patient denies known sick contacts, fever, arthralgias, myalgias, fatigue.  States pain, which "feels like gas", is relieved with cyclical rubbing around her navel.  Patient also notes she had more cramps during her last cycle.  States she uses a menstrual cup and noticed a quarter size clot during her last cycle which began 12/07/2019.  Patient wondering if this could have been miscarriage: Denies history thereof, known pregnancy.  Patient is currently sexually active with men, not using condoms or using birth control.  States she may have spotted on Monday, though is unsure.   Past Medical History:  Diagnosis Date  . Carpal tunnel syndrome during pregnancy     Patient Active Problem List   Diagnosis Date Noted  . Allergic dermatitis 08/18/2019  . Normal postpartum course 09/20/2018  . Normal labor 09/18/2018  . Carrier of group B Streptococcus 09/04/2018  . Low grade squamous intraepithelial lesion (LGSIL) on cervicovaginal cytologic smear 07/28/2016  . Vaginal delivery 06/10/2016  . Maternal varicella, non-immune 06/09/2016  . Positive GBS test 06/09/2016  . Obesity 06/09/2016  . Carpal tunnel syndrome during pregnancy 06/09/2016    Past Surgical History:  Procedure Laterality Date  . NO PAST SURGERIES      OB History    Gravida  3   Para  2   Term  2   Preterm      AB  1   Living  2     SAB  1   TAB       Ectopic      Multiple  0   Live Births  2            Home Medications    Prior to Admission medications   Medication Sig Start Date End Date Taking? Authorizing Provider  betamethasone valerate ointment (VALISONE) 0.1 % Apply 1 application topically 2 (two) times daily. 08/27/19   Janeece Agee, NP  Cetirizine HCl 10 MG TBDP Take 10 mg by mouth daily. 08/08/19   Cathie Hoops, Amy V, PA-C  ibuprofen (ADVIL,MOTRIN) 600 MG tablet Take 1 tablet (600 mg total) by mouth every 6 (six) hours. 09/20/18   [place], Lesly Rubenstein, FNP  norelgestromin-ethinyl estradiol (ORTHO EVRA) 150-35 MCG/24HR transdermal patch Place 1 patch onto the skin once a week. For 3 weeks, then 1 week without patch 09/17/19   Janeece Agee, NP  ondansetron (ZOFRAN) 4 MG tablet Take 1 tablet (4 mg total) by mouth every 6 (six) hours. 12/16/19   Hall-Potvin, [place], PA-C  permethrin (ELIMITE) 5 % cream Apply head to toe avoiding the eyes and genital region.  Wash off after 8 hours.  Repeat in 2 weeks. 07/26/19   Lawyer, Cristal Deer, PA-C  terbinafine (LAMISIL) 250 MG tablet Take 1 tablet (250 mg total) by mouth daily. 09/17/19   Janeece Agee, NP    Family History Family History  Problem Relation Age of Onset  .  Cancer Paternal Aunt   . Diabetes Paternal Aunt   . Diabetes Maternal Grandfather   . Diabetes Paternal Grandmother   . Alcohol abuse Neg Hx   . Arthritis Neg Hx   . Asthma Neg Hx   . Birth defects Neg Hx   . COPD Neg Hx   . Depression Neg Hx   . Drug abuse Neg Hx   . Early death Neg Hx   . Hearing loss Neg Hx   . Heart disease Neg Hx   . Hyperlipidemia Neg Hx   . Hypertension Neg Hx   . Kidney disease Neg Hx   . Learning disabilities Neg Hx   . Mental illness Neg Hx   . Mental retardation Neg Hx   . Miscarriages / Stillbirths Neg Hx   . Stroke Neg Hx   . Vision loss Neg Hx   . Varicose Veins Neg Hx     Social History Social History   Tobacco Use  . Smoking status: Former Smoker    Quit date:  06/07/2015    Years since quitting: 4.5  . Smokeless tobacco: Never Used  Substance Use Topics  . Alcohol use: Yes    Comment: 2 glassess of wine a week  . Drug use: No     Allergies   Patient has no known allergies.   Review of Systems As per HPI   Physical Exam Triage Vital Signs ED Triage Vitals  Enc Vitals Group     BP      Pulse      Resp      Temp      Temp src      SpO2      Weight      Height      Head Circumference      Peak Flow      Pain Score      Pain Loc      Pain Edu?      Excl. in [place]?    No data found.  Updated Vital Signs BP 116/71 (BP Location: Left Arm)   Pulse 66   Temp 98 F (36.7 C) (Oral)   Resp 16   LMP 12/07/2019   SpO2 99%   Visual Acuity Right Eye Distance:   Left Eye Distance:   Bilateral Distance:    Right Eye Near:   Left Eye Near:    Bilateral Near:     Physical Exam Constitutional:      General: She is not in acute distress. HENT:     Head: Normocephalic and atraumatic.  Eyes:     General: No scleral icterus.    Pupils: Pupils are equal, round, and reactive to light.  Cardiovascular:     Rate and Rhythm: Normal rate.  Pulmonary:     Effort: Pulmonary effort is normal.  Abdominal:     General: Bowel sounds are normal. There is no distension or abdominal bruit.     Palpations: Abdomen is soft. There is no hepatomegaly, splenomegaly or mass.     Tenderness: There is abdominal tenderness. There is no right CVA tenderness, left CVA tenderness or guarding. Negative signs include Murphy's sign, Rovsing's sign and McBurney's sign.     Comments: Mild lower abdominal pain  Genitourinary:    Uterus: Absent.      Comments: Patient declined, self-swab performe Skin:    Coloration: Skin is not jaundiced or pale.  Neurological:     Mental Status: She is alert and oriented to  person, place, and time.      UC Treatments / Results  Labs (all labs ordered are listed, but only abnormal results are displayed) Labs  Reviewed  POCT URINE PREGNANCY    EKG   Radiology No results found.  Procedures Procedures (including critical care time)  Medications Ordered in UC Medications - No data to display  Initial Impression / Assessment and Plan / UC Course  I have reviewed the triage vital signs and the nursing notes.  Pertinent labs & imaging results that were available during my care of the patient were reviewed by me and considered in my medical decision making (see chart for details).     Patient afebrile, nontoxic in office today.  Exam reassuring with diffuse mild lower abdominal tenderness without rebound, McBurney's, Rovsing sign.  Patient denies nausea in office today, though states that it is worse when she eats certain foods: We will trial Zofran for nausea, encourage oral hydration which patient has been able to do well thus far.  Will treat supportively, try bland dry and keep symptom log until she is able to follow-up with PCP.  Return precautions discussed, patient verbalized understanding and is agreeable to plan. Final Clinical Impressions(s) / UC Diagnoses   Final diagnoses:  Lower abdominal pain     Discharge Instructions     Drink water! Read info on foods to eat & help with symptoms Keep log to review with PCP in 1 week. Go to ER for worsening pain, vomiting, inability to keep down liquids, fever, blood in stool    ED Prescriptions    Medication Sig Dispense Auth. Provider   ondansetron (ZOFRAN) 4 MG tablet Take 1 tablet (4 mg total) by mouth every 6 (six) hours. 12 tablet Hall-Potvin, [place], PA-C     PDMP not reviewed this encounter.   Hall-Potvin, [place], [place] 12/16/19 1642

## 2019-12-16 NOTE — ED Notes (Signed)
Patient able to ambulate independently  

## 2019-12-16 NOTE — ED Triage Notes (Addendum)
Pt presents to [facility] for assessment of abdominal pain, vomiting, nausea and diarrhea x 3 days.  States she is not keeping food down.  Loss of appetite.  Patient states she threw up a few times Saturday and Sunday.  States water is okay, but food makes her feel nauseous.  Pain is to bilateral lower abdomen.  Diarrhea 1 time in the last 24 hours, c/o looser stools and pain prior to BM.  Patient states she may have eaten some undercooked chicken last week.

## 2019-12-16 NOTE — Discharge Instructions (Addendum)
Drink water! Read info on foods to eat & help with symptoms Keep log to review with PCP in 1 week. Go to ER for worsening pain, vomiting, inability to keep down liquids, fever, blood in stool

## 2019-12-24 IMAGING — US US MFM OB LIMITED
1 series · 15 of 22 positions shown · non-contrast
Comparison: none

[Series 1: us mfm ob limited · 15 of 22 slices shown]
[im 1/22]
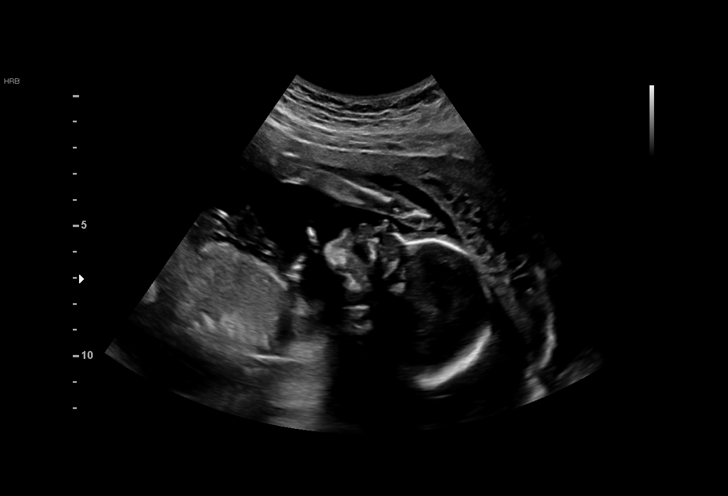
[im 3/22]
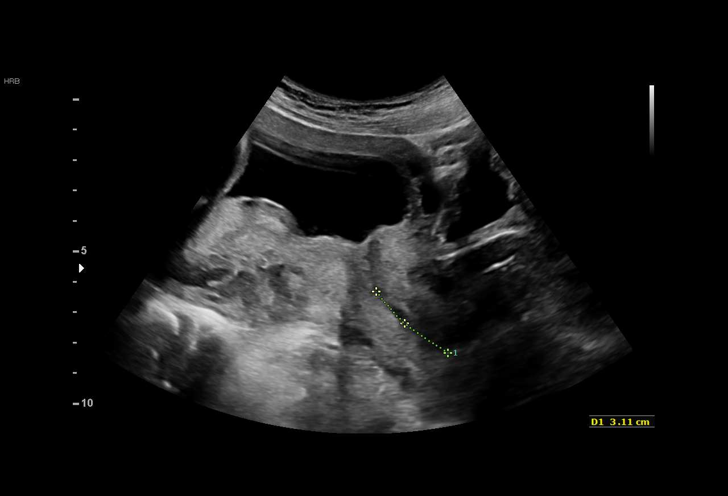
[im 4/22]
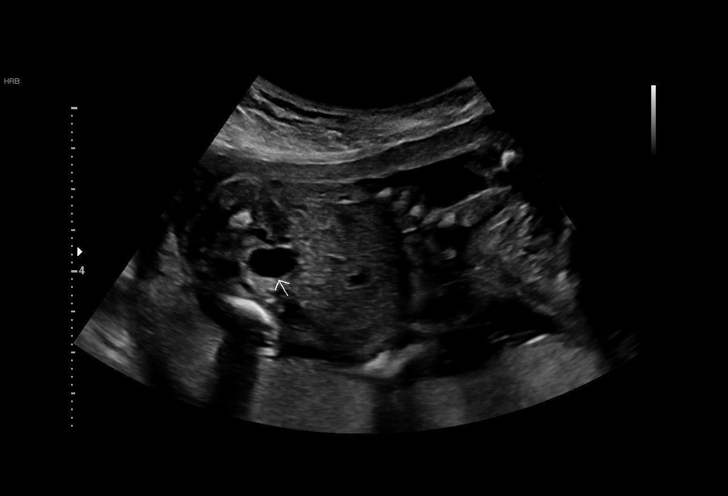
[im 6/22]
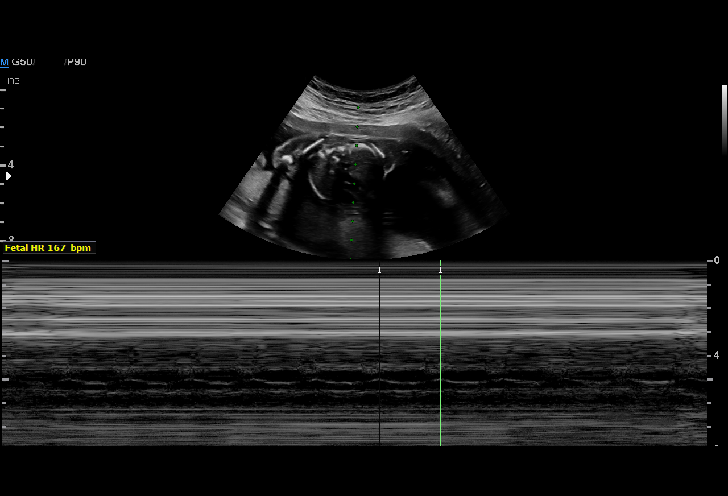
[im 7/22]
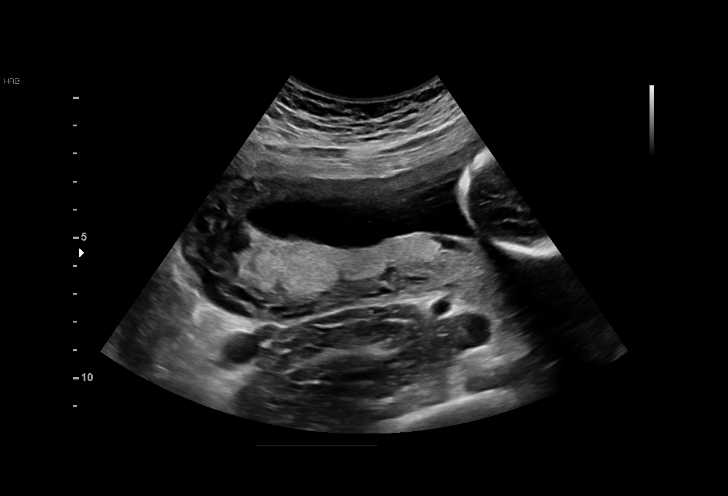
[im 9/22]
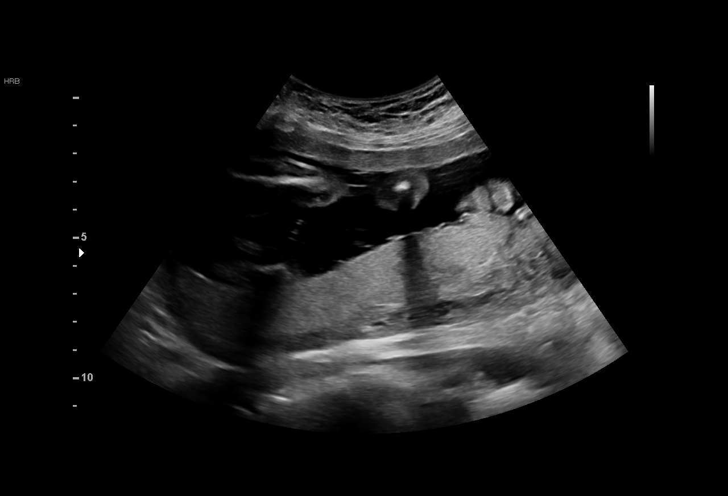
[im 10/22]
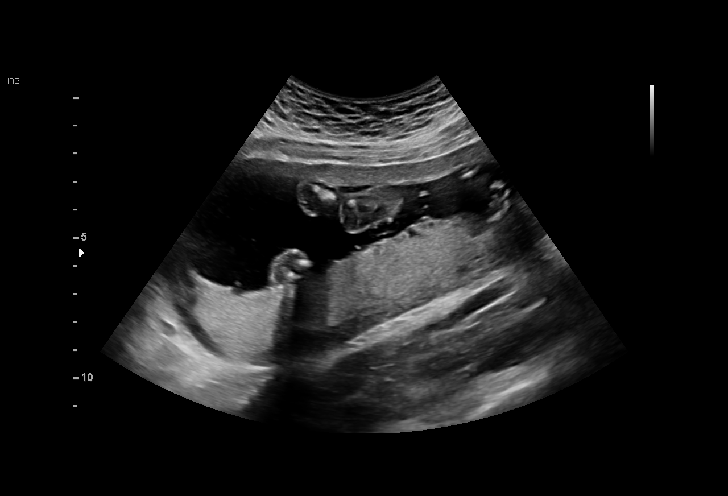
[im 12/22]
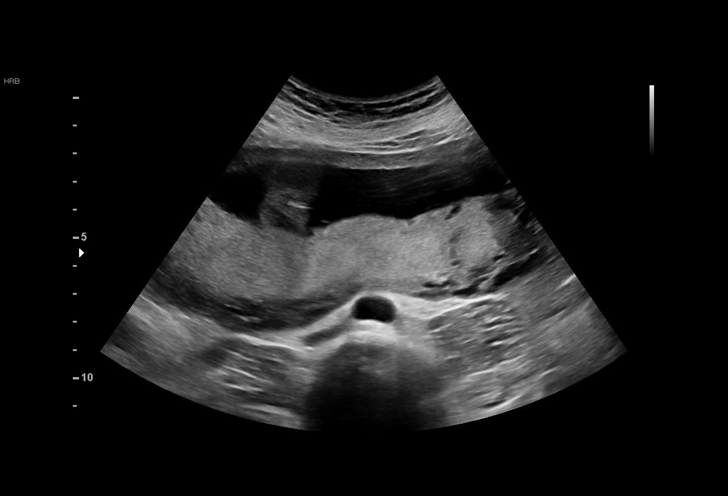
[im 13/22]
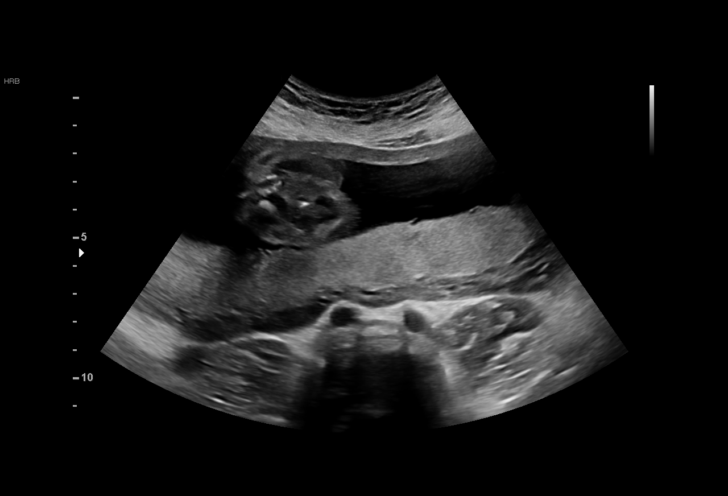
[im 14/22]
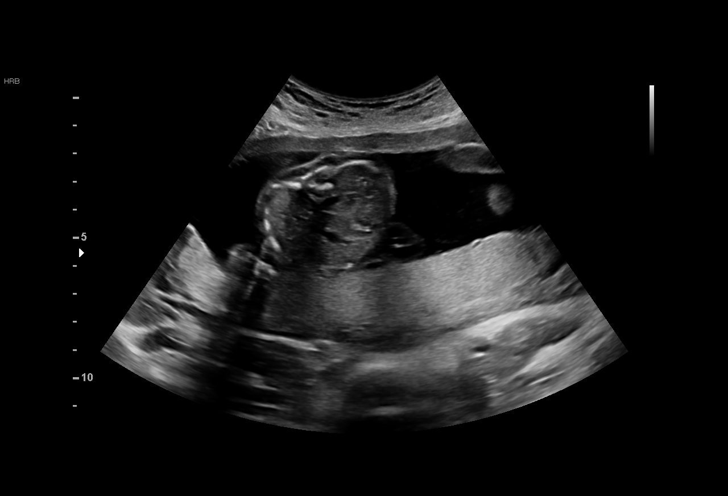
[im 16/22]
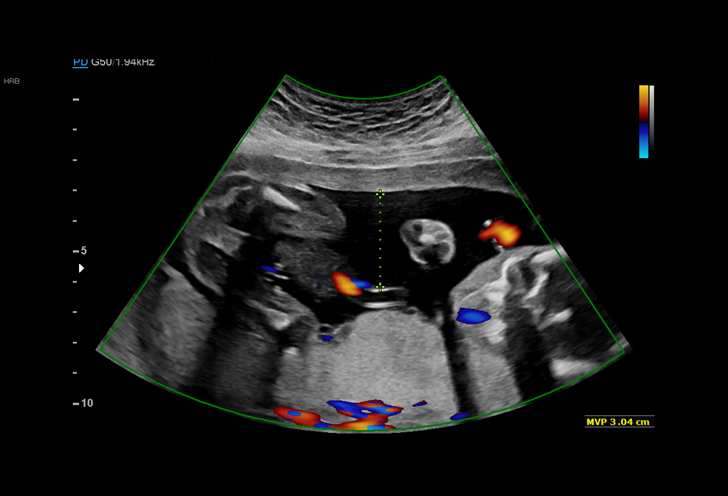
[im 17/22]
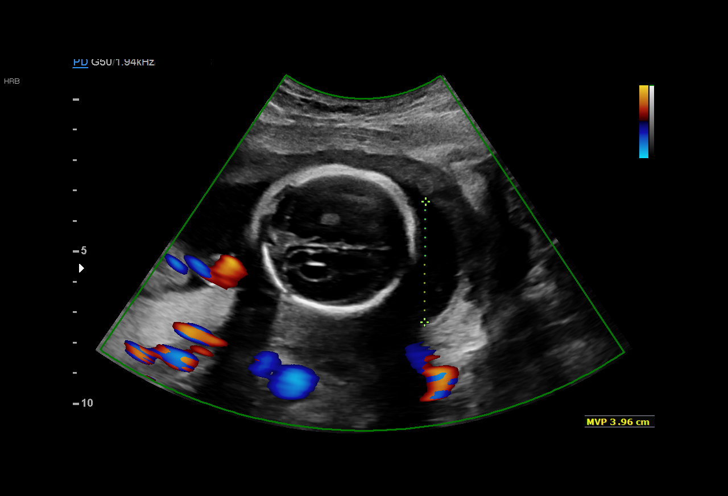
[im 19/22]
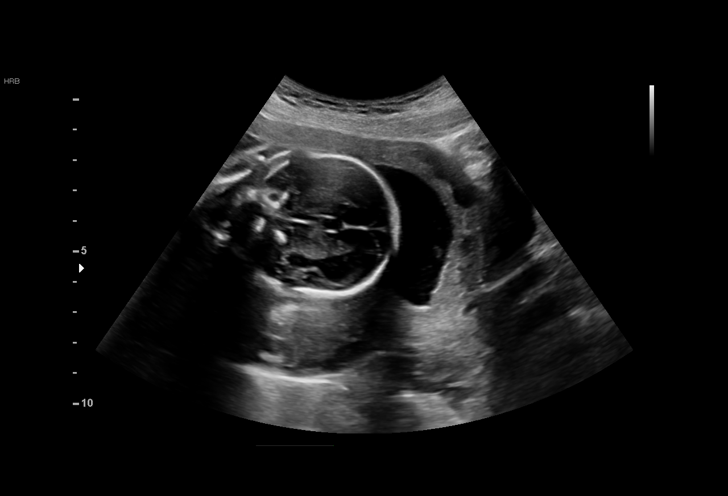
[im 20/22]
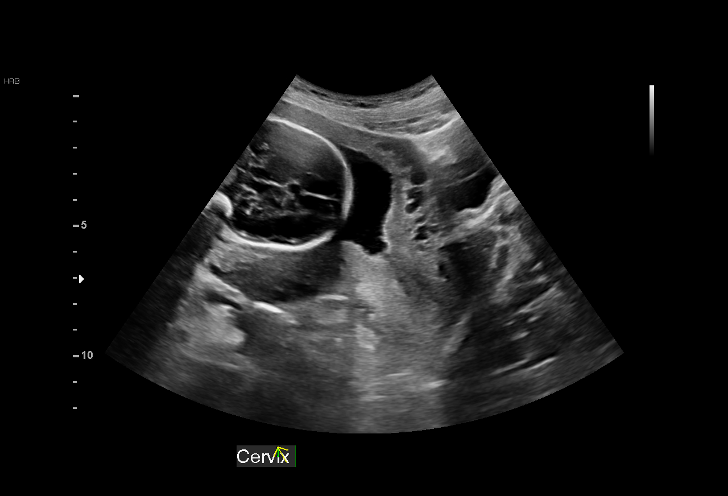
[im 22/22]
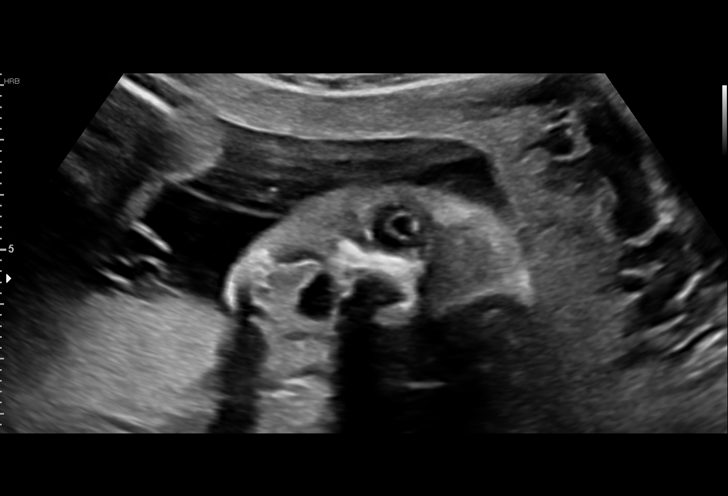

[15 of 22 positions shown; findings below may reference images not displayed]

1  CARL               [id number]      [id number]     [id number]
YURIY
Indications

21 weeks gestation of pregnancy
Leakage of amniotic fluid
OB History

Blood Type:            Height:  5'5"   Weight (lb):  170       BMI:
Gravidity:    3         Term:   1         SAB:   1
Living:       1
Fetal Evaluation

Num Of Fetuses:     1
Fetal Heart         167
Rate(bpm):
Cardiac Activity:   Observed
Presentation:       Cephalic
Placenta:           Posterior

Amniotic Fluid
AFI FV:      Subjectively within normal limits

Largest Pocket(cm)
3.5
Gestational Age

Best:          21w 4d     Det. By:  Previous Ultrasound      EDD:   09/27/18
Anatomy

Ventricles:            Appears normal         Abdomen:                Appears normal
Thoracic:              Appears normal         Bladder:                Appears normal
Stomach:               Appears normal, left
sided
Cervix Uterus Adnexa

Cervix
Length:           3.49  cm.
Normal appearance by transabdominal scan.

Uterus
No abnormality visualized.
Impression

Patient was evaluated in the [facility] with c/o vomiting,
abdominal cramping and possible leakage of amniotic fluid.
This study was remotely read. A limited ultrasound study was
performed. Amniotic fluid is normal and good fetal activity is
seen.
On transabdominal scan, the cervix looks long and closed.
Recommendations

Follow-up scans as clinically indicated.

## 2020-01-09 ENCOUNTER — Encounter: Payer: Self-pay | Admitting: Emergency Medicine

## 2020-01-09 ENCOUNTER — Other Ambulatory Visit: Payer: Self-pay

## 2020-01-09 ENCOUNTER — Ambulatory Visit
Admission: EM | Admit: 2020-01-09 | Discharge: 2020-01-09 | Disposition: A | Discharge status: Home / Self Care | Payer: Medicaid Other | Attending: Physician Assistant | Admitting: Physician Assistant

## 2020-01-09 DIAGNOSIS — Z3202 Encounter for pregnancy test, result negative: Secondary | ICD-10-CM | POA: Diagnosis not present

## 2020-01-09 DIAGNOSIS — N898 Other specified noninflammatory disorders of vagina: Secondary | ICD-10-CM

## 2020-01-09 LAB — POCT URINE PREGNANCY: Preg Test, Ur: NEGATIVE

## 2020-01-09 MED ORDER — FLUCONAZOLE 150 MG PO TABS: 150.0000 mg | ORAL_TABLET | Freq: Every day | ORAL | 0 refills | Status: DC

## 2020-01-09 NOTE — ED Triage Notes (Signed)
Pt presents to [facility] for assessment of 2 days of vaginal itching.  Patient unsure of discharge because she is finishing her cycle.  Patient states she uses a DIVA Cup for her cycles.

## 2020-01-09 NOTE — Discharge Instructions (Signed)
You were treated empirically for yeast, start diflucan as directed. Avoid any hygiene product to the area for now. Cytology sent, you will be contacted with any positive results that requires further treatment. Refrain from sexual activity and alcohol use for the next 7 days. Monitor for any worsening of symptoms, fever, abdominal pain, nausea, vomiting, to follow up for reevaluation.

## 2020-01-09 NOTE — ED Provider Notes (Signed)
[facility]    CSN: [id number] Arrival date & time: 01/09/20  1359      History   Chief Complaint Chief Complaint  Patient presents with  . Vaginal Itching    HPI Deborah Lozano is a 29 y.o. female.   29 year old female comes in for 2 day history of vaginal itching. No obvious vaginal discharge. Has some dysuria without urinary frequency, urgency, hematuria. Denies abdominal pain, nausea, vomiting. Denies fever, chills, flank/back pain. Sexually active 2 female partner no condom use. Has had new body wash change 1 week ago. LMP 01/04/2020. Just finished her cycle, uses DIVA cup, but states usually puts ointment for comfort, but did not use this cycle.  Also used chemical hair removal to the labia for the first time 1-2 weeks ago.      Past Medical History:  Diagnosis Date  . Carpal tunnel syndrome during pregnancy     Patient Active Problem List   Diagnosis Date Noted  . Allergic dermatitis 08/18/2019  . Normal postpartum course 09/20/2018  . Normal labor 09/18/2018  . Carrier of group B Streptococcus 09/04/2018  . Low grade squamous intraepithelial lesion (LGSIL) on cervicovaginal cytologic smear 07/28/2016  . Vaginal delivery 06/10/2016  . Maternal varicella, non-immune 06/09/2016  . Positive GBS test 06/09/2016  . Obesity 06/09/2016  . Carpal tunnel syndrome during pregnancy 06/09/2016    Past Surgical History:  Procedure Laterality Date  . NO PAST SURGERIES      OB History    Gravida  3   Para  2   Term  2   Preterm      AB  1   Living  2     SAB  1   TAB      Ectopic      Multiple  0   Live Births  2            Home Medications    Prior to Admission medications   Medication Sig Start Date End Date Taking? Authorizing Provider  betamethasone valerate ointment (VALISONE) 0.1 % Apply 1 application topically 2 (two) times daily. 08/27/19   Janeece Agee, NP  Cetirizine HCl 10 MG TBDP Take 10 mg by mouth daily. 08/08/19    Cathie Hoops, Amy V, PA-C  fluconazole (DIFLUCAN) 150 MG tablet Take 1 tablet (150 mg total) by mouth daily. Take second dose 72 hours later if symptoms still persists. 01/09/20   Cathie Hoops, Amy V, PA-C  ibuprofen (ADVIL,MOTRIN) 600 MG tablet Take 1 tablet (600 mg total) by mouth every 6 (six) hours. 09/20/18   [place], Lesly Rubenstein, FNP  ondansetron (ZOFRAN) 4 MG tablet Take 1 tablet (4 mg total) by mouth every 6 (six) hours. 12/16/19   Hall-Potvin, [place], PA-C  permethrin (ELIMITE) 5 % cream Apply head to toe avoiding the eyes and genital region.  Wash off after 8 hours.  Repeat in 2 weeks. 07/26/19   Lawyer, Cristal Deer, PA-C  terbinafine (LAMISIL) 250 MG tablet Take 1 tablet (250 mg total) by mouth daily. 09/17/19   Janeece Agee, NP  norelgestromin-ethinyl estradiol (ORTHO EVRA) 150-35 MCG/24HR transdermal patch Place 1 patch onto the skin once a week. For 3 weeks, then 1 week without patch 09/17/19 01/09/20  Janeece Agee, NP    Family History Family History  Problem Relation Age of Onset  . Cancer Paternal Aunt   . Diabetes Paternal Aunt   . Diabetes Maternal Grandfather   . Diabetes Paternal Grandmother   . Alcohol abuse Neg  Hx   . Arthritis Neg Hx   . Asthma Neg Hx   . Birth defects Neg Hx   . COPD Neg Hx   . Depression Neg Hx   . Drug abuse Neg Hx   . Early death Neg Hx   . Hearing loss Neg Hx   . Heart disease Neg Hx   . Hyperlipidemia Neg Hx   . Hypertension Neg Hx   . Kidney disease Neg Hx   . Learning disabilities Neg Hx   . Mental illness Neg Hx   . Mental retardation Neg Hx   . Miscarriages / Stillbirths Neg Hx   . Stroke Neg Hx   . Vision loss Neg Hx   . Varicose Veins Neg Hx     Social History Social History   Tobacco Use  . Smoking status: Former Smoker    Quit date: 06/07/2015    Years since quitting: 4.5  . Smokeless tobacco: Never Used  Substance Use Topics  . Alcohol use: Yes    Comment: 2 glassess of wine a week  . Drug use: No     Allergies   Patient has no known  allergies.   Review of Systems Review of Systems  Reason unable to perform ROS: See HPI as above.     Physical Exam Triage Vital Signs ED Triage Vitals [01/09/20 1408]  Enc Vitals Group     BP 116/71     Pulse Rate 84     Resp 16     Temp 98.6 F (37 C)     Temp Source Temporal     SpO2 99 %     Weight      Height      Head Circumference      Peak Flow      Pain Score 0     Pain Loc      Pain Edu?      Excl. in [place]?    No data found.  Updated Vital Signs BP 116/71 (BP Location: Left Arm)   Pulse 84   Temp 98.6 F (37 C) (Temporal)   Resp 16   SpO2 99%      Physical Exam Constitutional:      General: She is not in acute distress.    Appearance: She is well-developed. She is not ill-appearing, toxic-appearing or diaphoretic.  HENT:     Head: Normocephalic and atraumatic.  Eyes:     Conjunctiva/sclera: Conjunctivae normal.     Pupils: Pupils are equal, round, and reactive to light.  Cardiovascular:     Rate and Rhythm: Normal rate and regular rhythm.  Pulmonary:     Effort: Pulmonary effort is normal. No respiratory distress.     Comments: LCTAB Abdominal:     General: Bowel sounds are normal.     Palpations: Abdomen is soft.     Tenderness: There is no abdominal tenderness. There is no right CVA tenderness, left CVA tenderness, guarding or rebound.  Genitourinary:    Comments: No swelling, erythema, warmth. Excoriation to the right labia majora without rash. No surrounding erythema, warmth. No tenderness to palpation. No obvious vaginal discharge noted.  Musculoskeletal:     Cervical back: Normal range of motion and neck supple.  Skin:    General: Skin is warm and dry.  Neurological:     Mental Status: She is alert and oriented to person, place, and time.  Psychiatric:        Behavior: Behavior normal.  Judgment: Judgment normal.      UC Treatments / Results  Labs (all labs ordered are listed, but only abnormal results are displayed)  Labs Reviewed  POCT URINE PREGNANCY - Normal  CERVICOVAGINAL ANCILLARY ONLY    EKG   Radiology No results found.  Procedures Procedures (including critical care time)  Medications Ordered in UC Medications - No data to display  Initial Impression / Assessment and Plan / UC Course  I have reviewed the triage vital signs and the nursing notes.  Pertinent labs & imaging results that were available during my care of the patient were reviewed by me and considered in my medical decision making (see chart for details).    No alarming signs on exam. Given multiple new hygiene product changes, will cover yeast with diflucan. Also discussed possible irritation/dryness, to avoid hygiene product to the area for now. Cytology sent. Return precautions given. Patient expresses understanding and agrees to plan.   Final Clinical Impressions(s) / UC Diagnoses   Final diagnoses:  Vaginal itching   ED Prescriptions    Medication Sig Dispense Auth. Provider   fluconazole (DIFLUCAN) 150 MG tablet Take 1 tablet (150 mg total) by mouth daily. Take second dose 72 hours later if symptoms still persists. 2 tablet Ok Edwards, PA-C     PDMP not reviewed this encounter.   Ok Edwards, PA-C 01/09/20 1437

## 2020-01-13 ENCOUNTER — Ambulatory Visit: Payer: Medicaid Other | Admitting: Registered Nurse

## 2020-01-14 ENCOUNTER — Telehealth (HOSPITAL_COMMUNITY): Payer: Self-pay | Admitting: Emergency Medicine

## 2020-01-14 LAB — CERVICOVAGINAL ANCILLARY ONLY
Chlamydia: NEGATIVE
Neisseria Gonorrhea: NEGATIVE
Trichomonas: POSITIVE — AB

## 2020-01-14 MED ORDER — METRONIDAZOLE 500 MG PO TABS: 2000.0000 mg | ORAL_TABLET | Freq: Once | ORAL | 0 refills | Status: AC

## 2020-01-14 NOTE — Telephone Encounter (Signed)
Trichomonas is positive. Rx  for Flagyl 2 grams, once was sent to the pharmacy of record. Pt needs education to refrain from sexual intercourse for 7 days to give the medicine time to work. Sexual partners need to be notified and tested/treated. Condoms may reduce risk of reinfection. Recheck for further evaluation if symptoms are not improving.   Patient contacted by phone and made aware of    results. Pt verbalized understanding and had all questions answered.    

## 2020-01-30 ENCOUNTER — Encounter: Payer: Self-pay | Admitting: Registered Nurse

## 2020-02-03 ENCOUNTER — Encounter: Payer: Self-pay | Admitting: Registered Nurse

## 2020-02-03 ENCOUNTER — Other Ambulatory Visit: Payer: Self-pay

## 2020-02-03 ENCOUNTER — Ambulatory Visit: Payer: Medicaid Other | Admitting: Registered Nurse

## 2020-02-03 VITALS — BP 118/72 | HR 65 | Temp 97.4°F | Ht 65.5 in | Wt 153.2 lb

## 2020-02-03 DIAGNOSIS — R768 Other specified abnormal immunological findings in serum: Secondary | ICD-10-CM | POA: Diagnosis not present

## 2020-02-03 DIAGNOSIS — F411 Generalized anxiety disorder: Secondary | ICD-10-CM

## 2020-02-03 MED ORDER — VALACYCLOVIR HCL 500 MG PO TABS: 500.0000 mg | ORAL_TABLET | Freq: Every day | ORAL | 3 refills | Status: DC

## 2020-02-03 NOTE — Patient Instructions (Signed)
° ° ° °  If you have lab work done today you will be contacted with your lab results within the next 2 weeks.  If you have not heard from us then please contact us. The fastest way to get your results is to register for My Chart. ° ° °IF you received an x-ray today, you will receive an invoice from [facility]. Please contact [facility] at [phone] with questions or concerns regarding your invoice.  ° °IF you received labwork today, you will receive an invoice from [facility]. Please contact [facility] at [phone] with questions or concerns regarding your invoice.  ° °Our billing staff will not be able to assist you with questions regarding bills from these companies. ° °You will be contacted with the lab results as soon as they are available. The fastest way to get your results is to activate your My Chart account. Instructions are located on the last page of this paperwork. If you have not heard from us regarding the results in 2 weeks, please contact this office. °  ° ° ° °

## 2020-02-03 NOTE — Progress Notes (Signed)
Established Patient Office Visit  Subjective:  Patient ID: Deborah Lozano, female    DOB: 09-27-1991  Age: 29 y.o. MRN: [id number]  CC:  Chief Complaint  Patient presents with  . Advice Only    patient states that she was diagnosed with Herpes 2 and have a have some questions and concerns     HPI Deborah Lozano presents for HSV-2 seropositive without outbreak  Recently tested, HSV-2 seropositive, no history of outbreak, unfamiliar with symptoms, unsure of where or when she could have been exposed Wants to discuss prognosis, prevention, and possibility for spread.  Past Medical History:  Diagnosis Date  . Carpal tunnel syndrome during pregnancy     Past Surgical History:  Procedure Laterality Date  . NO PAST SURGERIES      Family History  Problem Relation Age of Onset  . Cancer Paternal Aunt   . Diabetes Paternal Aunt   . Diabetes Maternal Grandfather   . Diabetes Paternal Grandmother   . Alcohol abuse Neg Hx   . Arthritis Neg Hx   . Asthma Neg Hx   . Birth defects Neg Hx   . COPD Neg Hx   . Depression Neg Hx   . Drug abuse Neg Hx   . Early death Neg Hx   . Hearing loss Neg Hx   . Heart disease Neg Hx   . Hyperlipidemia Neg Hx   . Hypertension Neg Hx   . Kidney disease Neg Hx   . Learning disabilities Neg Hx   . Mental illness Neg Hx   . Mental retardation Neg Hx   . Miscarriages / Stillbirths Neg Hx   . Stroke Neg Hx   . Vision loss Neg Hx   . Varicose Veins Neg Hx     Social History   Socioeconomic History  . Marital status: Single    Spouse name: Not on file  . Number of children: 2  . Years of education: Not on file  . Highest education level: Not on file  Occupational History  . Not on file  Tobacco Use  . Smoking status: Former Smoker    Quit date: 06/07/2015    Years since quitting: 4.6  . Smokeless tobacco: Never Used  Substance and Sexual Activity  . Alcohol use: Yes    Comment: 2 glassess of wine a week  . Drug use: No  . Sexual  activity: Yes    Comment: last IC 09/17/18  Other Topics Concern  . Not on file  Social History Narrative  . Not on file   Social Determinants of Health   Financial Resource Strain: Low Risk   . Difficulty of Paying Living Expenses: Not hard at all  Food Insecurity: No Food Insecurity  . Worried About Charity fundraiser in the Last Year: Never true  . Ran Out of Food in the Last Year: Never true  Transportation Needs: No Transportation Needs  . Lack of Transportation (Medical): No  . Lack of Transportation (Non-Medical): No  Physical Activity: Sufficiently Active  . Days of Exercise per Week: 5 days  . Minutes of Exercise per Session: 60 min  Stress: No Stress Concern Present  . Feeling of Stress : Only a little  Social Connections: Unknown  . Frequency of Communication with Friends and Family: Three times a week  . Frequency of Social Gatherings with Friends and Family: Twice a week  . Attends Religious Services: Patient refused  . Active Member of Clubs or Organizations:  Patient refused  . Attends Banker Meetings: Patient refused  . Marital Status: Never married  Intimate Partner Violence: Not At Risk  . Fear of Current or Ex-Partner: No  . Emotionally Abused: No  . Physically Abused: No  . Sexually Abused: No    Outpatient Medications Prior to Visit  Medication Sig Dispense Refill  . betamethasone valerate ointment (VALISONE) 0.1 % Apply 1 application topically 2 (two) times daily. 30 g 2  . Cetirizine HCl 10 MG TBDP Take 10 mg by mouth daily. 30 tablet 0  . fluconazole (DIFLUCAN) 150 MG tablet Take 1 tablet (150 mg total) by mouth daily. Take second dose 72 hours later if symptoms still persists. 2 tablet 0  . ibuprofen (ADVIL,MOTRIN) 600 MG tablet Take 1 tablet (600 mg total) by mouth every 6 (six) hours. 30 tablet 0  . ondansetron (ZOFRAN) 4 MG tablet Take 1 tablet (4 mg total) by mouth every 6 (six) hours. 12 tablet 0  . permethrin (ELIMITE) 5 % cream  Apply head to toe avoiding the eyes and genital region.  Wash off after 8 hours.  Repeat in 2 weeks. 30 g 1  . terbinafine (LAMISIL) 250 MG tablet Take 1 tablet (250 mg total) by mouth daily. 84 tablet 0   No facility-administered medications prior to visit.    No Known Allergies  ROS Review of Systems  Constitutional: Negative.   HENT: Negative.   Eyes: Negative.   Respiratory: Negative.   Cardiovascular: Negative.   Gastrointestinal: Negative.   Endocrine: Negative.   Genitourinary: Negative.   Musculoskeletal: Negative.   Skin: Negative.   Allergic/Immunologic: Negative.   Neurological: Negative.   Hematological: Negative.   Psychiatric/Behavioral: Negative.   All other systems reviewed and are negative.     Objective:    Physical Exam  Constitutional: She is oriented to person, place, and time. She appears well-developed and well-nourished. No distress.  Cardiovascular: Normal rate and regular rhythm.  Pulmonary/Chest: Effort normal. No respiratory distress.  Neurological: She is alert and oriented to person, place, and time.  Skin: Skin is warm and dry. No rash noted. She is not diaphoretic. No erythema. No pallor.  Psychiatric: Her speech is normal and behavior is normal. Judgment and thought content normal. Her mood appears anxious. Her affect is not angry, not blunt, not labile and not inappropriate. Cognition and memory are normal. She does not exhibit a depressed mood.  Nursing note and vitals reviewed.   BP 118/72   Pulse 65   Temp (!) 97.4 F (36.3 C) (Temporal)   Ht 5' 5.5" (1.664 m)   Wt 153 lb 3.2 oz (69.5 kg)   SpO2 100%   BMI 25.11 kg/m  Wt Readings from Last 3 Encounters:  02/03/20 153 lb 3.2 oz (69.5 kg)  09/17/19 176 lb (79.8 kg)  08/18/19 183 lb (83 kg)     There are no preventive care reminders to display for this patient.  There are no preventive care reminders to display for this patient.  Lab Results  Component Value Date   TSH  1.010 09/17/2019   Lab Results  Component Value Date   WBC 6.4 09/17/2019   HGB 13.8 09/17/2019   HCT 42.0 09/17/2019   MCV 93 09/17/2019   PLT 225 09/17/2019   Lab Results  Component Value Date   NA 140 09/17/2019   K 4.3 09/17/2019   CO2 21 09/17/2019   GLUCOSE 86 09/17/2019   BUN 10 09/17/2019   CREATININE  0.83 09/17/2019   BILITOT 0.5 09/17/2019   ALKPHOS 80 09/17/2019   AST 15 09/17/2019   ALT 24 09/17/2019   PROT 7.2 09/17/2019   ALBUMIN 4.6 09/17/2019   CALCIUM 9.5 09/17/2019   ANIONGAP 11 05/21/2018   Lab Results  Component Value Date   CHOL 133 09/17/2019   Lab Results  Component Value Date   HDL 43 09/17/2019   Lab Results  Component Value Date   LDLCALC 74 09/17/2019   Lab Results  Component Value Date   TRIG 81 09/17/2019   Lab Results  Component Value Date   CHOLHDL 3.1 09/17/2019   Lab Results  Component Value Date   HGBA1C 5.1 09/17/2019      Assessment & Plan:   Problem List Items Addressed This Visit      Other   Anxiety state   Relevant Orders   Ambulatory referral to Psychology   HSV-2 seropositive - Primary   Relevant Medications   valACYclovir (VALTREX) 500 MG tablet      Meds ordered this encounter  Medications  . valACYclovir (VALTREX) 500 MG tablet    Sig: Take 1 tablet (500 mg total) by mouth daily.    Dispense:  90 tablet    Refill:  3    Order Specific Question:   Supervising Provider    Answer:   Doristine Bosworth [id number]    Follow-up: No follow-ups on file.   PLAN  Discussed pathophysiology of HSV and reasons to be and not to be concerned  Suppressive therapy: Valtrex 500mg  PO qd  Discussed symptoms of outbreak, reasons to return to clinic, reasons to be concerned  Pt states she is going through a lot, wants to start seeing a counselor. Referral placed.  Patient encouraged to call clinic with any questions, comments, or concerns.  , NP

## 2020-06-13 ENCOUNTER — Other Ambulatory Visit: Payer: Self-pay

## 2020-06-13 ENCOUNTER — Encounter: Payer: Self-pay | Admitting: Emergency Medicine

## 2020-06-13 ENCOUNTER — Ambulatory Visit
Admission: EM | Admit: 2020-06-13 | Discharge: 2020-06-13 | Disposition: A | Discharge status: Home / Self Care | Payer: Medicaid Other | Attending: Physician Assistant | Admitting: Physician Assistant

## 2020-06-13 DIAGNOSIS — R112 Nausea with vomiting, unspecified: Secondary | ICD-10-CM

## 2020-06-13 MED ORDER — ONDANSETRON 4 MG PO TBDP: 4.0000 mg | ORAL_TABLET | Freq: Three times a day (TID) | ORAL | 0 refills | Status: DC | PRN

## 2020-06-13 MED ORDER — ONDANSETRON HCL 4 MG/2ML IJ SOLN
4.0000 mg | Freq: Once | INTRAMUSCULAR | Status: AC
  Administered 2020-06-13: 4 mg via INTRAMUSCULAR

## 2020-06-13 MED ORDER — ONDANSETRON 4 MG PO TBDP
4.0000 mg | ORAL_TABLET | Freq: Once | ORAL | Status: AC
  Administered 2020-06-13: 4 mg via ORAL

## 2020-06-13 NOTE — ED Triage Notes (Signed)
Pt sts N/V starting this morning after drinking ETOH last night; pt sts some diarrhea also

## 2020-06-13 NOTE — ED Notes (Signed)
Pt was nauseated after fluid challenge; per AY zofran IM given

## 2020-06-13 NOTE — Discharge Instructions (Signed)
Zofran for nausea and vomiting as needed. Keep hydrated, you urine should be clear to pale yellow in color. Bland diet, advance as tolerated. Monitor for any worsening of symptoms, nausea or vomiting not controlled by medication, worsening abdominal pain, fever, go to the emergency department for further evaluation needed.  ° °

## 2020-06-13 NOTE — ED Provider Notes (Signed)
[facility]    CSN: [id number] Arrival date & time: 06/13/20  1406      History   Chief Complaint Chief Complaint  Patient presents with  . Nausea  . Emesis    HPI Deborah Lozano is a 29 y.o. female.   29 year old female comes in for continued nausea/vomiting this morning after drinking liquor last night. Mild diarrhea. Umbilical pain, worse with inhalation. Denies fevers. LMP 05/23/2020     Past Medical History:  Diagnosis Date  . Carpal tunnel syndrome during pregnancy     Patient Active Problem List   Diagnosis Date Noted  . Anxiety state 02/03/2020  . HSV-2 seropositive 02/03/2020  . Allergic dermatitis 08/18/2019  . Normal postpartum course 09/20/2018  . Normal labor 09/18/2018  . Carrier of group B Streptococcus 09/04/2018  . Low grade squamous intraepithelial lesion (LGSIL) on cervicovaginal cytologic smear 07/28/2016  . Vaginal delivery 06/10/2016  . Maternal varicella, non-immune 06/09/2016  . Positive GBS test 06/09/2016  . Obesity 06/09/2016  . Carpal tunnel syndrome during pregnancy 06/09/2016    Past Surgical History:  Procedure Laterality Date  . NO PAST SURGERIES      OB History    Gravida  3   Para  2   Term  2   Preterm      AB  1   Living  2     SAB  1   TAB      Ectopic      Multiple  0   Live Births  2            Home Medications    Prior to Admission medications   Medication Sig Start Date End Date Taking? Authorizing Provider  betamethasone valerate ointment (VALISONE) 0.1 % Apply 1 application topically 2 (two) times daily. Patient not taking: Reported on 06/13/2020 08/27/19   Janeece Agee, NP  Cetirizine HCl 10 MG TBDP Take 10 mg by mouth daily. 08/08/19   Cathie Hoops, Tyah Acord V, PA-C  ibuprofen (ADVIL,MOTRIN) 600 MG tablet Take 1 tablet (600 mg total) by mouth every 6 (six) hours. 09/20/18   [place], Lesly Rubenstein, FNP  ondansetron (ZOFRAN ODT) 4 MG disintegrating tablet Take 1 tablet (4 mg total) by mouth every 8  (eight) hours as needed for nausea or vomiting. 06/13/20   Cathie Hoops, Fronie Holstein V, PA-C  valACYclovir (VALTREX) 500 MG tablet Take 1 tablet (500 mg total) by mouth daily. 02/03/20   Janeece Agee, NP  norelgestromin-ethinyl estradiol (ORTHO EVRA) 150-35 MCG/24HR transdermal patch Place 1 patch onto the skin once a week. For 3 weeks, then 1 week without patch 09/17/19 01/09/20  Janeece Agee, NP    Family History Family History  Problem Relation Age of Onset  . Cancer Paternal Aunt   . Diabetes Paternal Aunt   . Diabetes Maternal Grandfather   . Diabetes Paternal Grandmother   . Alcohol abuse Neg Hx   . Arthritis Neg Hx   . Asthma Neg Hx   . Birth defects Neg Hx   . COPD Neg Hx   . Depression Neg Hx   . Drug abuse Neg Hx   . Early death Neg Hx   . Hearing loss Neg Hx   . Heart disease Neg Hx   . Hyperlipidemia Neg Hx   . Hypertension Neg Hx   . Kidney disease Neg Hx   . Learning disabilities Neg Hx   . Mental illness Neg Hx   . Mental retardation Neg Hx   .  Miscarriages / Stillbirths Neg Hx   . Stroke Neg Hx   . Vision loss Neg Hx   . Varicose Veins Neg Hx     Social History Social History   Tobacco Use  . Smoking status: Former Smoker    Quit date: 06/07/2015    Years since quitting: 5.0  . Smokeless tobacco: Never Used  Vaping Use  . Vaping Use: Never used  Substance Use Topics  . Alcohol use: Yes    Comment: 2 glassess of wine a week  . Drug use: No     Allergies   Patient has no known allergies.   Review of Systems Review of Systems  Reason unable to perform ROS: See HPI as above.     Physical Exam Triage Vital Signs ED Triage Vitals  Enc Vitals Group     BP 06/13/20 1458 (!) 146/77     Pulse Rate 06/13/20 1458 69     Resp 06/13/20 1458 18     Temp 06/13/20 1458 97.7 F (36.5 C)     Temp Source 06/13/20 1458 Oral     SpO2 06/13/20 1458 94 %     Weight --      Height --      Head Circumference --      Peak Flow --      Pain Score 06/13/20 1507 8     Pain  Loc --      Pain Edu? --      Excl. in GC? --    No data found.  Updated Vital Signs BP (!) 146/77 (BP Location: Left Arm)   Pulse 69   Temp 97.7 F (36.5 C) (Oral)   Resp 18   SpO2 94%   Physical Exam Constitutional:      General: She is not in acute distress.    Appearance: She is well-developed. She is not ill-appearing, toxic-appearing or diaphoretic.  HENT:     Head: Normocephalic and atraumatic.  Eyes:     Conjunctiva/sclera: Conjunctivae normal.     Pupils: Pupils are equal, round, and reactive to light.  Cardiovascular:     Rate and Rhythm: Normal rate and regular rhythm.  Pulmonary:     Effort: Pulmonary effort is normal. No respiratory distress.     Comments: LCTAB Abdominal:     General: Bowel sounds are normal.     Palpations: Abdomen is soft.     Tenderness: There is no abdominal tenderness. There is no right CVA tenderness, left CVA tenderness, guarding or rebound.  Musculoskeletal:     Cervical back: Normal range of motion and neck supple.  Skin:    General: Skin is warm and dry.  Neurological:     Mental Status: She is alert and oriented to person, place, and time.  Psychiatric:        Behavior: Behavior normal.        Judgment: Judgment normal.      UC Treatments / Results  Labs (all labs ordered are listed, but only abnormal results are displayed) Labs Reviewed - No data to display  EKG   Radiology No results found.  Procedures Procedures (including critical care time)  Medications Ordered in UC Medications  ondansetron (ZOFRAN-ODT) disintegrating tablet 4 mg (4 mg Oral Given 06/13/20 1511)  ondansetron (ZOFRAN) injection 4 mg (4 mg Intramuscular Given 06/13/20 1556)    Initial Impression / Assessment and Plan / UC Course  I have reviewed the triage vital signs and the nursing notes.  Pertinent labs & imaging results that were available during my care of the patient were reviewed by me and considered in my medical decision making (see  chart for details).    zofran given in office today. Able to tolerate fluid intake. Continue zofran for nausea. Push fluids. Bland diet, advance as tolerated. Return precautions given.  Final Clinical Impressions(s) / UC Diagnoses   Final diagnoses:  Intractable vomiting with nausea, unspecified vomiting type   ED Prescriptions    Medication Sig Dispense Auth. Provider   ondansetron (ZOFRAN ODT) 4 MG disintegrating tablet Take 1 tablet (4 mg total) by mouth every 8 (eight) hours as needed for nausea or vomiting. 20 tablet Belinda Fisher, PA-C     PDMP not reviewed this encounter.   Belinda Fisher, PA-C 06/13/20 1657

## 2020-10-18 DIAGNOSIS — Z3490 Encounter for supervision of normal pregnancy, unspecified, unspecified trimester: Secondary | ICD-10-CM | POA: Insufficient documentation

## 2020-10-18 DIAGNOSIS — A6000 Herpesviral infection of urogenital system, unspecified: Secondary | ICD-10-CM | POA: Insufficient documentation

## 2020-10-25 LAB — OB RESULTS CONSOLE RUBELLA ANTIBODY, IGM: Rubella: IMMUNE

## 2020-10-25 LAB — OB RESULTS CONSOLE HIV ANTIBODY (ROUTINE TESTING): HIV: NONREACTIVE

## 2020-10-25 LAB — OB RESULTS CONSOLE HEPATITIS B SURFACE ANTIGEN: Hepatitis B Surface Ag: NEGATIVE

## 2020-10-25 LAB — OB RESULTS CONSOLE RPR: RPR: NONREACTIVE

## 2020-10-26 LAB — OB RESULTS CONSOLE GC/CHLAMYDIA
Chlamydia: NEGATIVE
Gonorrhea: NEGATIVE

## 2020-11-06 NOTE — L&D Delivery Note (Signed)
Delivery Note Labor onset: 05/27/2021  Labor Onset Time: 1730 Complete dilation at 10:55 PM  Onset of pushing at 2256 FHR second stage Cat 1 Analgesia/Anesthesia intrapartum: Epidural  Guided pushing with maternal urge. Delivery of a viable female at 2301. Fetal head delivered in LOP position.  Nuchal cord: none.  Infant placed on maternal abd, dried, and tactile stim.  Cord double clamped after 2 min and cut by father.  Father and pt's mother present for birth.  Cord blood sample collected: Yes Arterial cord blood sample collected: No  Placenta delivered Tomasa Blase, intact, with 3 VC.  Placenta to L&D. Uterine tone firm, bleeding scant  No laceration identified.  Anesthesia: N/A Repair: N/A EBL (mL): 150 Complications: none APGAR: APGAR (1 MIN): 9   APGAR (5 MINS): 9   APGAR (10 MINS):   Mom to postpartum.  Baby to Couplet care / Skin to Skin.  Roma Schanz MSN, CNM 05/27/2021, 11:35 PM

## 2021-03-11 ENCOUNTER — Inpatient Hospital Stay (HOSPITAL_COMMUNITY)
Admission: AD | Admit: 2021-03-11 | Discharge: 2021-03-11 | Disposition: A | Discharge status: Home / Self Care | Payer: Medicaid Other | Attending: Obstetrics and Gynecology | Admitting: Obstetrics and Gynecology

## 2021-03-11 ENCOUNTER — Other Ambulatory Visit: Payer: Self-pay

## 2021-03-11 ENCOUNTER — Encounter (HOSPITAL_COMMUNITY): Payer: Self-pay | Admitting: *Deleted

## 2021-03-11 DIAGNOSIS — O26893 Other specified pregnancy related conditions, third trimester: Secondary | ICD-10-CM | POA: Diagnosis not present

## 2021-03-11 DIAGNOSIS — Z87891 Personal history of nicotine dependence: Secondary | ICD-10-CM | POA: Diagnosis not present

## 2021-03-11 DIAGNOSIS — Z0371 Encounter for suspected problem with amniotic cavity and membrane ruled out: Secondary | ICD-10-CM

## 2021-03-11 DIAGNOSIS — Z3A28 28 weeks gestation of pregnancy: Secondary | ICD-10-CM

## 2021-03-11 DIAGNOSIS — N898 Other specified noninflammatory disorders of vagina: Secondary | ICD-10-CM

## 2021-03-11 LAB — WET PREP, GENITAL
Clue Cells Wet Prep HPF POC: NONE SEEN
Clue Cells Wet Prep HPF POC: NONE SEEN
Sperm: NONE SEEN
Sperm: NONE SEEN
Trich, Wet Prep: NONE SEEN
Trich, Wet Prep: NONE SEEN
Yeast Wet Prep HPF POC: NONE SEEN
Yeast Wet Prep HPF POC: NONE SEEN

## 2021-03-11 LAB — POCT FERN TEST: POCT Fern Test: NEGATIVE

## 2021-03-11 NOTE — MAU Provider Note (Signed)
History     CSN: [id number]  Arrival date and time: 03/11/21 1505   Event Date/Time   First Provider Initiated Contact with Patient 03/11/21 1547      Chief Complaint  Patient presents with  . Rupture of Membranes   HPI Deborah Lozano is a 30 y.o. [id number] at [redacted]w[redacted]d who presents with LOF. Reports gush of watery fluid earlier today. Has not continued to leak. Denies contractions or vaginal bleeding. No recent intercourse. Denies vaginal itching, irritation, or abnormal discharge. Denies dysuria. Reports good fetal movement. Goes to [facility]; was seen in office yesterday.   OB History    Gravida  4   Para  2   Term  2   Preterm      AB  1   Living  2     SAB  1   IAB      Ectopic      Multiple  0   Live Births  2           Past Medical History:  Diagnosis Date  . Carpal tunnel syndrome during pregnancy     Past Surgical History:  Procedure Laterality Date  . NO PAST SURGERIES      Family History  Problem Relation Age of Onset  . Cancer Paternal Aunt   . Diabetes Paternal Aunt   . Diabetes Maternal Grandfather   . Diabetes Paternal Grandmother   . Alcohol abuse Neg Hx   . Arthritis Neg Hx   . Asthma Neg Hx   . Birth defects Neg Hx   . COPD Neg Hx   . Depression Neg Hx   . Drug abuse Neg Hx   . Early death Neg Hx   . Hearing loss Neg Hx   . Heart disease Neg Hx   . Hyperlipidemia Neg Hx   . Hypertension Neg Hx   . Kidney disease Neg Hx   . Learning disabilities Neg Hx   . Mental illness Neg Hx   . Mental retardation Neg Hx   . Miscarriages / Stillbirths Neg Hx   . Stroke Neg Hx   . Vision loss Neg Hx   . Varicose Veins Neg Hx     Social History   Tobacco Use  . Smoking status: Former Smoker    Quit date: 06/07/2015    Years since quitting: 5.7  . Smokeless tobacco: Never Used  Vaping Use  . Vaping Use: Never used  Substance Use Topics  . Alcohol use: Yes    Comment: 2 glassess of wine a week  . Drug use: No    Allergies: No Known  Allergies  Medications Prior to Admission  Medication Sig Dispense Refill Last Dose  . betamethasone valerate ointment (VALISONE) 0.1 % Apply 1 application topically 2 (two) times daily. (Patient not taking: Reported on 06/13/2020) 30 g 2   . Cetirizine HCl 10 MG TBDP Take 10 mg by mouth daily. 30 tablet 0   . ibuprofen (ADVIL,MOTRIN) 600 MG tablet Take 1 tablet (600 mg total) by mouth every 6 (six) hours. 30 tablet 0   . ondansetron (ZOFRAN ODT) 4 MG disintegrating tablet Take 1 tablet (4 mg total) by mouth every 8 (eight) hours as needed for nausea or vomiting. 20 tablet 0   . valACYclovir (VALTREX) 500 MG tablet Take 1 tablet (500 mg total) by mouth daily. 90 tablet 3     Review of Systems  Constitutional: Negative.   Gastrointestinal: Negative.   Genitourinary: Positive for  vaginal discharge. Negative for dysuria and vaginal bleeding.   Physical Exam   Blood pressure (!) 136/57, pulse 83, temperature 97.9 F (36.6 C), temperature source Oral, resp. rate 20, height 5\' 5"  (1.651 m), weight 84.8 kg, SpO2 97 %, unknown if currently breastfeeding.  Physical Exam Vitals and nursing note reviewed. Exam conducted with a chaperone present.  Constitutional:      General: She is not in acute distress.    Appearance: Normal appearance. She is normal weight.  HENT:     Head: Normocephalic and atraumatic.  Eyes:     General: No scleral icterus. Pulmonary:     Effort: Pulmonary effort is normal. No respiratory distress.  Abdominal:     Palpations: Abdomen is soft.     Tenderness: There is no abdominal tenderness.  Genitourinary:    General: Normal vulva.     Exam position: Lithotomy position.     Vagina: Vaginal discharge and erythema present.     Cervix: Erythema present. No cervical bleeding.     Comments: SSE: no pooling of fluid. Small amount of clumpy discharge adherent to cervix with green watery component. Cervix visually closed. Vaginal walls & cervix erythematous.  Skin:     General: Skin is warm and dry.  Neurological:     Mental Status: She is alert.  Psychiatric:        Mood and Affect: Mood normal.        Behavior: Behavior normal.     NST:  Baseline: 145 bpm, Variability: Good {> 6 bpm), Accelerations: 10x10, Decelerations: Absent and no contractions  MAU Course  Procedures Results for orders placed or performed during the hospital encounter of 03/11/21 (from the past 24 hour(s))  Wet prep, genital     Status: Abnormal   Collection Time: 03/11/21  4:10 PM   Specimen: Vaginal  Result Value Ref Range   Yeast Wet Prep HPF POC NONE SEEN NONE SEEN   Trich, Wet Prep NONE SEEN NONE SEEN   Clue Cells Wet Prep HPF POC NONE SEEN NONE SEEN   WBC, Wet Prep HPF POC MANY (A) NONE SEEN   Sperm NONE SEEN   POCT fern test     Status: Normal   Collection Time: 03/11/21  4:18 PM  Result Value Ref Range   POCT Fern Test Negative = intact amniotic membranes   Wet prep, genital     Status: Abnormal   Collection Time: 03/11/21  4:42 PM   Specimen: Vaginal  Result Value Ref Range   Yeast Wet Prep HPF POC NONE SEEN NONE SEEN   Trich, Wet Prep NONE SEEN NONE SEEN   Clue Cells Wet Prep HPF POC NONE SEEN NONE SEEN   WBC, Wet Prep HPF POC MANY (A) NONE SEEN   Sperm NONE SEEN     MDM Patient presents with gush of fluid. Sterile speculum exam performed - no pooling of amniotic fluid & fern negative. Cervix visually closed - digital exam deferred. Vaginal discharge & exam consistent with yeast but wet prep negative. Pt has no vaginitis symptoms - offered rx terazol to pick up if symptoms develop - pt declines. Patient remains concerned regarding leaking - I suspect it's either urine or r/t discharge - offered amnisure - pt declines.   At end of visit - pt concerned related to contractions. Reports feeling tightening that is uncomfortable - occurred 3 times in an hour. No other symptoms. Didn't give urine sample. Offered to continue monitoring, assess urine, and check  cervix -  pt declines.  Discussed s/s of preterm labor & reasons to return to MAU   Assessment and Plan   1. Encounter for suspected PROM, with rupture of membranes not found   2. Vaginal discharge during pregnancy in third trimester   3. [redacted] weeks gestation of pregnancy    -GC/CT pending -reviewed reasons to return to MAU -keep f/u with OB  Judeth Horn 03/11/2021, 3:47 PM

## 2021-03-11 NOTE — MAU Note (Signed)
Presents with c/o LOF since 1355 this afternoon.  Reports fluid is clear.  Denies VB.  Endorses +FM.

## 2021-03-11 NOTE — Discharge Instructions (Signed)
Warning Signs During Pregnancy During pregnancy, your body goes through many changes. Some changes may be uncomfortable, but most do not represent a serious problem. However, it is important to learn when certain signs and symptoms may indicate a problem. Talk with your health care provider about your current health and any medical conditions you have. Make sure you know the symptoms to watch for and report. How does this affect me? Warning signs during pregnancy Let your health care provider know if you have any of the following warning signs:  Dizziness or feeling faint.  Nausea, vomiting, or diarrhea that lasts 24 hours or longer.  Spotting or bleeding from your vagina.  Abdominal cramping or pain in your pelvis or lower back.  Shortness of breath, difficulty breathing, or chest pain.  New or increased pain, swelling, or redness in an arm or leg.  Your baby is moving less than usual or is not moving. You should also watch for signs of a serious medical condition called preeclampsia. This may include:  A severe, throbbing headache that does not go away.  Vision changes, such as blurred or double vision, light sensitivity, or seeing spots in front of your eyes.  Sudden or extreme swelling of your face, hands, legs, or feet. Pregnancy causes changes that may make it more likely for you to get an infection. Let your health care provider know if you have signs of infection, such as:  A fever.  A bad-smelling vaginal discharge.  Pain or burning when you urinate. How does this affect my baby? Throughout your pregnancy, always report any of the warning signs of a problem to your health care provider. This can help prevent complications that may affect your baby, including:  Increased risk for premature birth.  Infection that may be transmitted to your baby.  Increased risk for stillbirth. Follow these instructions at home:  Take over-the-counter and prescription medicines only  as told by your health care provider.  Keep all follow-up visits. This is important.   Where to find more information  Office on Women's Health: [employer]  [employer]necologists: [employer] Contact a health care provider if:  You have any warning signs of problems during your pregnancy.  Any of the following apply to you during your pregnancy: ? You have strong emotions, such as sadness or anxiety, that interfere with work or personal relationships. ? You feel unsafe in your home. ? You are using tobacco products, alcohol, or drugs, and you need help to stop. Get help right away if:  You have signs or symptoms of labor before 37 weeks of pregnancy. These include: ? Contractions that are 5 minutes or less apart, or that increase in frequency, intensity, or length. ? Sudden, sharp abdominal pain or low back pain. ? Uncontrolled gush or trickle of fluid from your vagina. Summary  Always report any warning signs to your health care provider to prevent complications that may affect both you and your baby.  Talk with your health care provider about your current health and any medical conditions you have. Make sure you know the symptoms to watch for and report.  Keep all follow-up visits. This is important. This information is not intended to replace advice given to you by your health care provider. Make sure you discuss any questions you have with your health care provider. Document Revised: 03/31/2020 Document Reviewed: 02/27/2020 Elsevier Patient Education  2021 Elsevier Inc.    Fetal Movement Counts Patient Name: ________________________________________________ Patient Due Date:  ____________________  What is a fetal movement count? A fetal movement count is the number of times that you feel your baby move during a certain amount of time. This may also be called a fetal kick count. A fetal movement count is  recommended for every pregnant woman. You may be asked to start counting fetal movements as early as week 28 of your pregnancy. Pay attention to when your baby is most active. You may notice your baby's sleep and wake cycles. You may also notice things that make your baby move more. You should do a fetal movement count:  When your baby is normally most active.  At the same time each day. A good time to count movements is while you are resting, after having something to eat and drink. How do I count fetal movements? 1. Find a quiet, comfortable area. Sit, or lie down on your side. 2. Write down the date, the start time and stop time, and the number of movements that you felt between those two times. Take this information with you to your health care visits. 3. Write down your start time when you feel the first movement. 4. Count kicks, flutters, swishes, rolls, and jabs. You should feel at least 10 movements. 5. You may stop counting after you have felt 10 movements, or if you have been counting for 2 hours. Write down the stop time. 6. If you do not feel 10 movements in 2 hours, contact your health care provider for further instructions. Your health care provider may want to do additional tests to assess your baby's well-being. Contact a health care provider if:  You feel fewer than 10 movements in 2 hours.  Your baby is not moving like he or she usually does. Date: ____________ Start time: ____________ Stop time: ____________ Movements: ____________ Date: ____________ Start time: ____________ Stop time: ____________ Movements: ____________ Date: ____________ Start time: ____________ Stop time: ____________ Movements: ____________ Date: ____________ Start time: ____________ Stop time: ____________ Movements: ____________ Date: ____________ Start time: ____________ Stop time: ____________ Movements: ____________ Date: ____________ Start time: ____________ Stop time: ____________ Movements:  ____________ Date: ____________ Start time: ____________ Stop time: ____________ Movements: ____________ Date: ____________ Start time: ____________ Stop time: ____________ Movements: ____________ Date: ____________ Start time: ____________ Stop time: ____________ Movements: ____________ This information is not intended to replace advice given to you by your health care provider. Make sure you discuss any questions you have with your health care provider. Document Revised: 06/12/2019 Document Reviewed: 06/12/2019 Elsevier Patient Education  2021 [employer].

## 2021-03-14 LAB — GC/CHLAMYDIA PROBE AMP (~~LOC~~) NOT AT ARMC
Chlamydia: NEGATIVE
Comment: NEGATIVE
Comment: NORMAL
Neisseria Gonorrhea: NEGATIVE

## 2021-04-12 ENCOUNTER — Ambulatory Visit: Payer: Medicaid Other | Admitting: Psychology

## 2021-04-19 ENCOUNTER — Ambulatory Visit: Payer: Medicaid Other | Admitting: Psychology

## 2021-04-25 ENCOUNTER — Ambulatory Visit: Payer: Medicaid Other | Admitting: Podiatry

## 2021-04-26 ENCOUNTER — Other Ambulatory Visit: Payer: Self-pay

## 2021-04-26 ENCOUNTER — Encounter (HOSPITAL_COMMUNITY): Payer: Self-pay | Admitting: Obstetrics & Gynecology

## 2021-04-26 ENCOUNTER — Inpatient Hospital Stay (HOSPITAL_COMMUNITY)
Admission: AD | Admit: 2021-04-26 | Discharge: 2021-04-26 | Disposition: A | Discharge status: Home / Self Care | Payer: Medicaid Other | Attending: Obstetrics & Gynecology | Admitting: Obstetrics & Gynecology

## 2021-04-26 DIAGNOSIS — Z0371 Encounter for suspected problem with amniotic cavity and membrane ruled out: Secondary | ICD-10-CM

## 2021-04-26 DIAGNOSIS — Z3A34 34 weeks gestation of pregnancy: Secondary | ICD-10-CM

## 2021-04-26 DIAGNOSIS — O368130 Decreased fetal movements, third trimester, not applicable or unspecified: Secondary | ICD-10-CM | POA: Insufficient documentation

## 2021-04-26 DIAGNOSIS — O4703 False labor before 37 completed weeks of gestation, third trimester: Secondary | ICD-10-CM

## 2021-04-26 DIAGNOSIS — O368131 Decreased fetal movements, third trimester, fetus 1: Secondary | ICD-10-CM

## 2021-04-26 DIAGNOSIS — O479 False labor, unspecified: Secondary | ICD-10-CM

## 2021-04-26 LAB — URINALYSIS, ROUTINE W REFLEX MICROSCOPIC
Bilirubin Urine: NEGATIVE
Glucose, UA: NEGATIVE mg/dL
Hgb urine dipstick: NEGATIVE
Ketones, ur: NEGATIVE mg/dL
Nitrite: NEGATIVE
Protein, ur: NEGATIVE mg/dL
Specific Gravity, Urine: 1.016 (ref 1.005–1.030)
pH: 6 (ref 5.0–8.0)

## 2021-04-26 LAB — AMNISURE RUPTURE OF MEMBRANE (ROM) NOT AT ARMC: Amnisure ROM: NEGATIVE

## 2021-04-26 LAB — POCT FERN TEST: POCT Fern Test: NEGATIVE

## 2021-04-26 NOTE — MAU Note (Addendum)
Deborah Lozano is a 30 y.o. at [redacted]w[redacted]d here in MAU reporting: woke up at 0530 with some watery discharge, states it was clear. Doesn't feel like she has continued to leak. While she was at work she started having cramps. DFM.  Onset of complaint: today  Pain score: 4/10  Vitals:   04/26/21 1458  BP: 136/81  Pulse: 85  Resp: 16  Temp: 98.7 F (37.1 C)  SpO2: 99%     FHT: pt wearing fitted jumpsuit, will obtain FHT in room  Lab orders placed from triage: UA

## 2021-04-26 NOTE — MAU Provider Note (Signed)
Chief Complaint  Patient presents with   Rupture of Membranes   Abdominal Pain   Decreased Fetal Movement      Event Date/Time   First Provider Initiated Contact with Patient 04/26/21 1528         S: Deborah Lozano  is a 30 y.o. y.o. year old G2P2012 female at [redacted]w[redacted]d weeks gestation who presents to MAU reporting one episode of leaking of clear fluid since 0530. Soaked through her underpants.  Also reports increased cramping and decreased fetal mvmt.  Has been eating and drinking well today.    Contractions: mild, 4/10 Vaginal bleeding: Denies Fetal movement: Decreased  O: Patient Vitals for the past 24 hrs:  BP Temp Temp src Pulse Resp SpO2 Height Weight  04/26/21 1655 135/70 -- -- 79 15 -- -- --  04/26/21 1651 -- -- -- -- -- 100 % -- --  04/26/21 1512 133/65 -- -- 80 -- -- -- --  04/26/21 1458 136/81 98.7 F (37.1 C) Oral 85 16 99 % -- --  04/26/21 1455 -- -- -- -- -- -- 5\' 5"  (1.651 m) 89.6 kg   General: NAD Heart: Regular rate Lungs: Normal rate and effort Abd: Soft, NT, Gravid, S=D Pelvic: NEFG, Neg pooling, no blood.  Dilation: Fingertip (externally 1cm) Effacement (%): Thick Cervical Position: Middle Station: Ballotable Presentation: Undeterminable Exam by:: [email or web address], CNM  EFM: 135, Moderate variability, 15 x 15 accelerations, no decelerations Toco: UI  Neg Fern Neg Amnisure  MAU Course/MDM - No evidence of SROM. Fern, pooling and Amnisure negative.   - Braxton Hicks contractions w/out evidence of active preterm labor. Cervix closed.   - Decreased fetal mvmt resolved upon arrival to MAU. FHR reactive.   A: [redacted]w[redacted]d week IUP 1. No leakage of amniotic fluid into vagina   2. Braxton Hicks contractions   3. Decreased fetal movement, third trimester, fetus 1   FHR reactive  P: Discharge home in stable condition. Preterm Labor precautions and fetal kick counts. Push fluids.  Follow-up as scheduled for prenatal visit or sooner as needed if symptoms  worsen. Return to maternity admissions as needed if symptoms worsen.  [redacted]w[redacted]d, Katrinka Blazing, CNM 04/26/2021 5:02 PM  2

## 2021-04-26 NOTE — MAU Note (Signed)
Patient states she has been feeling the baby move and that she may have not been pushing the fetal movement button hard enough. Provider notified.

## 2021-04-30 ENCOUNTER — Ambulatory Visit (INDEPENDENT_AMBULATORY_CARE_PROVIDER_SITE_OTHER): Payer: Medicaid Other | Admitting: Podiatry

## 2021-04-30 ENCOUNTER — Encounter: Payer: Self-pay | Admitting: Podiatry

## 2021-04-30 ENCOUNTER — Other Ambulatory Visit: Payer: Self-pay

## 2021-04-30 DIAGNOSIS — B351 Tinea unguium: Secondary | ICD-10-CM | POA: Diagnosis not present

## 2021-04-30 DIAGNOSIS — L603 Nail dystrophy: Secondary | ICD-10-CM | POA: Diagnosis not present

## 2021-04-30 NOTE — Progress Notes (Signed)
  Subjective:  Patient ID: Deborah Lozano, female    DOB: 1990/11/18,  MRN: [id number]  Chief Complaint  Patient presents with   Nail Problem      (np) discoloration of toenails (left)    30 y.o. female presents with the above complaint. History confirmed with patient. States the nail is dark and discolored - has been this way for a few weeks. Put acrylic nail over it to cover it. Had pain previously, not so much now.  Objective:  Physical Exam: warm, good capillary refill, no trophic changes or ulcerative lesions, normal DP and PT pulses, and normal sensory exam. Left Foot: Left hallux nail dystrophy, lysis, subungual debris.     Assessment:   1. Onychomycosis   2. Nail dystrophy      Plan:  Patient was evaluated and treated and all questions answered.  Onychomycosis -More likely trauma than fungus -Loose nail debrided -Discussed we could consider more options post-partum if she is still having issues, but right now options are limited   No follow-ups on file.

## 2021-05-03 LAB — OB RESULTS CONSOLE GBS: GBS: NEGATIVE

## 2021-05-27 ENCOUNTER — Other Ambulatory Visit: Payer: Self-pay

## 2021-05-27 ENCOUNTER — Inpatient Hospital Stay (HOSPITAL_COMMUNITY): Payer: Medicaid Other | Admitting: Anesthesiology

## 2021-05-27 ENCOUNTER — Inpatient Hospital Stay (HOSPITAL_COMMUNITY)
Admission: AD | Admit: 2021-05-27 | Discharge: 2021-05-29 | DRG: 807 | Disposition: A | Discharge status: Home / Self Care | Payer: Medicaid Other | Attending: Obstetrics & Gynecology | Admitting: Obstetrics & Gynecology

## 2021-05-27 ENCOUNTER — Encounter (HOSPITAL_COMMUNITY): Payer: Self-pay | Admitting: Obstetrics and Gynecology

## 2021-05-27 DIAGNOSIS — A6000 Herpesviral infection of urogenital system, unspecified: Secondary | ICD-10-CM | POA: Diagnosis present

## 2021-05-27 DIAGNOSIS — Z20822 Contact with and (suspected) exposure to covid-19: Secondary | ICD-10-CM | POA: Diagnosis present

## 2021-05-27 DIAGNOSIS — O9832 Other infections with a predominantly sexual mode of transmission complicating childbirth: Principal | ICD-10-CM | POA: Diagnosis present

## 2021-05-27 DIAGNOSIS — Z3A39 39 weeks gestation of pregnancy: Secondary | ICD-10-CM | POA: Diagnosis not present

## 2021-05-27 DIAGNOSIS — O99214 Obesity complicating childbirth: Secondary | ICD-10-CM | POA: Diagnosis present

## 2021-05-27 DIAGNOSIS — O26893 Other specified pregnancy related conditions, third trimester: Secondary | ICD-10-CM | POA: Diagnosis present

## 2021-05-27 DIAGNOSIS — Z87891 Personal history of nicotine dependence: Secondary | ICD-10-CM | POA: Diagnosis not present

## 2021-05-27 LAB — TYPE AND SCREEN
ABO/RH(D): O POS
Antibody Screen: NEGATIVE

## 2021-05-27 LAB — RESP PANEL BY RT-PCR (FLU A&B, COVID) ARPGX2
Influenza A by PCR: NEGATIVE
Influenza B by PCR: NEGATIVE
SARS Coronavirus 2 by RT PCR: NEGATIVE

## 2021-05-27 LAB — CBC
HCT: 38.5 % (ref 36.0–46.0)
Hemoglobin: 12.7 g/dL (ref 12.0–15.0)
MCH: 30.1 pg (ref 26.0–34.0)
MCHC: 33 g/dL (ref 30.0–36.0)
MCV: 91.2 fL (ref 80.0–100.0)
Platelets: 176 10*3/uL (ref 150–400)
RBC: 4.22 MIL/uL (ref 3.87–5.11)
RDW: 14.3 % (ref 11.5–15.5)
WBC: 10.6 10*3/uL — ABNORMAL HIGH (ref 4.0–10.5)
nRBC: 0 % (ref 0.0–0.2)

## 2021-05-27 MED ORDER — ACETAMINOPHEN 325 MG PO TABS: 650.0000 mg | ORAL_TABLET | ORAL | Status: DC | PRN

## 2021-05-27 MED ORDER — FENTANYL CITRATE (PF) 100 MCG/2ML IJ SOLN
50.0000 ug | INTRAMUSCULAR | Status: DC | PRN
  Filled 2021-05-27: qty 2

## 2021-05-27 MED ORDER — EPHEDRINE 5 MG/ML INJ: 10.0000 mg | INTRAVENOUS | Status: DC | PRN

## 2021-05-27 MED ORDER — SOD CITRATE-CITRIC ACID 500-334 MG/5ML PO SOLN: 30.0000 mL | ORAL | Status: DC | PRN

## 2021-05-27 MED ORDER — ONDANSETRON HCL 4 MG/2ML IJ SOLN: 4.0000 mg | Freq: Four times a day (QID) | INTRAMUSCULAR | Status: DC | PRN

## 2021-05-27 MED ORDER — LACTATED RINGERS IV SOLN: 500.0000 mL | Freq: Once | INTRAVENOUS | Status: DC

## 2021-05-27 MED ORDER — LACTATED RINGERS IV SOLN: 500.0000 mL | INTRAVENOUS | Status: DC | PRN

## 2021-05-27 MED ORDER — OXYTOCIN-SODIUM CHLORIDE 30-0.9 UT/500ML-% IV SOLN
2.5000 [IU]/h | INTRAVENOUS | Status: DC
  Filled 2021-05-27: qty 500

## 2021-05-27 MED ORDER — FENTANYL CITRATE (PF) 100 MCG/2ML IJ SOLN
INTRAMUSCULAR | Status: DC | PRN
  Administered 2021-05-27: 100 ug via EPIDURAL

## 2021-05-27 MED ORDER — FENTANYL-BUPIVACAINE-NACL 0.5-0.125-0.9 MG/250ML-% EP SOLN
12.0000 mL/h | EPIDURAL | Status: DC | PRN
  Filled 2021-05-27: qty 250

## 2021-05-27 MED ORDER — LIDOCAINE HCL (PF) 1 % IJ SOLN
INTRAMUSCULAR | Status: DC | PRN
  Administered 2021-05-27: 10 mL via EPIDURAL
  Administered 2021-05-27: 2 mL via EPIDURAL

## 2021-05-27 MED ORDER — DIPHENHYDRAMINE HCL 50 MG/ML IJ SOLN: 12.5000 mg | INTRAMUSCULAR | Status: DC | PRN

## 2021-05-27 MED ORDER — LACTATED RINGERS IV SOLN: INTRAVENOUS | Status: DC

## 2021-05-27 MED ORDER — OXYTOCIN BOLUS FROM INFUSION
333.0000 mL | Freq: Once | INTRAVENOUS | Status: AC
  Administered 2021-05-27: 333 mL via INTRAVENOUS

## 2021-05-27 MED ORDER — PHENYLEPHRINE 40 MCG/ML (10ML) SYRINGE FOR IV PUSH (FOR BLOOD PRESSURE SUPPORT): 80.0000 ug | PREFILLED_SYRINGE | INTRAVENOUS | Status: DC | PRN

## 2021-05-27 MED ORDER — FLEET ENEMA 7-19 GM/118ML RE ENEM: 1.0000 | ENEMA | RECTAL | Status: DC | PRN

## 2021-05-27 MED ORDER — FENTANYL-BUPIVACAINE-NACL 0.5-0.125-0.9 MG/250ML-% EP SOLN
EPIDURAL | Status: DC | PRN
  Administered 2021-05-27: 12 mL/h via EPIDURAL

## 2021-05-27 MED ORDER — LIDOCAINE HCL (PF) 1 % IJ SOLN: 30.0000 mL | INTRAMUSCULAR | Status: DC | PRN

## 2021-05-27 NOTE — H&P (Signed)
OB ADMISSION/ HISTORY & PHYSICAL:  Admission Date: 05/27/2021  7:14 PM  Admit Diagnosis: Normal labor  Deborah Lozano is a 30 y.o. female [phone] [redacted]w[redacted]d presenting for labor eval. Endorses active FM, denies LOF and vaginal bleeding. Ctx began @ 1700.  History of current pregnancy: [id number]   Patient entered care with [facility] at 8+4 wks.   EDC 06/02/21 by 8+5 wk U/S.   Anatomy scan:  21 wks, complete w/ posterior placenta.     Significant prenatal events:  Patient Active Problem List   Diagnosis Date Noted   Normal labor 05/27/2021   HSV-2 seropositive 02/03/2020    Prenatal Labs: ABO, Rh: --/--/O POS (07/22 1955) Antibody: NEG (07/22 1955) Rubella:   immune RPR:   NR HBsAg:   NR HIV:   NR GTT: passed 1 hr GBS:   neg GC/CHL: neg/neg Genetics: low-risk female, neg Horizon Tdap/influenza vaccines: tdap UTD this preg   OB History  Gravida Para Term Preterm AB Living  4 2 2   1 2   SAB IAB Ectopic Multiple Live Births  1     0 2    # Outcome Date GA Lbr Len/2nd Weight Sex Delivery Anes PTL Lv  4 Current           3 Term 09/19/18 [redacted]w[redacted]d 13:31 / 00:07 2610 g M Vag-Spont EPI  LIV  2 Term 06/10/16 [redacted]w[redacted]d 15:11 / 00:23 3269 g M Vag-Spont EPI  LIV  1 SAB             Medical / Surgical History: Past medical history:  Past Medical History:  Diagnosis Date   Carpal tunnel syndrome during pregnancy     Past surgical history:  Past Surgical History:  Procedure Laterality Date   NO PAST SURGERIES     Family History:  Family History  Problem Relation Age of Onset   Cancer Paternal Aunt    Diabetes Paternal Aunt    Diabetes Maternal Grandfather    Diabetes Paternal Grandmother    Alcohol abuse Neg Hx    Arthritis Neg Hx    Asthma Neg Hx    Birth defects Neg Hx    COPD Neg Hx    Depression Neg Hx    Drug abuse Neg Hx    Early death Neg Hx    Hearing loss Neg Hx    Heart disease Neg Hx    Hyperlipidemia Neg Hx    Hypertension Neg Hx    Kidney disease Neg Hx    Learning  disabilities Neg Hx    Mental illness Neg Hx    Mental retardation Neg Hx    Miscarriages / Stillbirths Neg Hx    Stroke Neg Hx    Vision loss Neg Hx    Varicose Veins Neg Hx     Social History:  reports that she quit smoking about 5 years ago. She has never used smokeless tobacco. She reports current alcohol use. She reports that she does not use drugs.  Allergies: Patient has no known allergies.   Current Medications at time of admission:  Prior to Admission medications   Medication Sig Start Date End Date Taking? Authorizing Provider  Prenatal Vit-Fe Fumarate-FA (PRENATAL MULTIVITAMIN) TABS tablet Take 1 tablet by mouth daily at 12 noon.   Yes [provider]  valACYclovir (VALTREX) 1000 MG tablet Take 1,000 mg by mouth daily. 02/16/21  Yes [provider]  cetirizine (ZYRTEC) 10 MG tablet cetirizine 10 mg tablet  TAKE 1 TABLET BY  MOUTH ONCE DAILY    [provider]  loratadine (CLARITIN) 10 MG tablet Take 10 mg by mouth daily. 02/16/21   [provider]  valACYclovir (VALTREX) 500 MG tablet Take 1 tablet (500 mg total) by mouth daily. 02/03/20   Janeece Agee, NP  norelgestromin-ethinyl estradiol (ORTHO EVRA) 150-35 MCG/24HR transdermal patch Place 1 patch onto the skin once a week. For 3 weeks, then 1 week without patch 09/17/19 01/09/20  Janeece Agee, NP    Review of Systems: Constitutional: Negative   HENT: Negative   Eyes: Negative   Respiratory: Negative   Cardiovascular: Negative   Gastrointestinal: Negative  Genitourinary: neg for bloody show, neg for LOF   Musculoskeletal: Negative   Skin: Negative   Neurological: Negative   Endo/Heme/Allergies: Negative   Psychiatric/Behavioral: Negative    Physical Exam: VS: Temperature 98 F (36.7 C), temperature source Oral, resp. rate 16, SpO2 100 %, unknown if currently breastfeeding. AAO x3, no signs of distress Cardiovascular: RRR Respiratory: Lung fields clear to ausculation GU/GI:  Abdomen gravid, non-tender, non-distended, active FM, vertex, EFW 7# per Leopold's Extremities: trace edema, negative for pain, tenderness, and cords  Cervical exam:Dilation: 4.5 Effacement (%): 70 Station: -2 Exam by:: Lestine Box, RN FHR: baseline rate 125 / variability moderate / accelerations present / absent decelerations TOCO: 2-4   Prenatal Transfer Tool  Maternal Diabetes: No Genetic Screening: Normal Maternal Ultrasounds/Referrals: Normal Fetal Ultrasounds or other Referrals:  None Maternal Substance Abuse:  No Significant Maternal Medications:  None Significant Maternal Lab Results: Group B Strep negative    Assessment: 30 y.o. [id number] [redacted]w[redacted]d  Latent stage of labor HSV2 pos    -Valtrex since 34 weeks    -no prodromal symptoms FHR category 1 GBS neg Pain management plan: epidural   Plan:  Admit to L&D Routine admission orders Sterile spec exam negative for lesions Epidural PRN  Dr Richardson Dopp notified of admission and plan of care  Roma Schanz MSN, CNM 05/27/2021 7:52 PM

## 2021-05-27 NOTE — MAU Note (Signed)
Pt reports ctx started Monday, had membranes stripped in office. Pt reprots ctx started getting 2-3 min apart around 1730, had some bloody show in morning, denies current VB and denies LOF, has reported +FM, Hx HSV, no current outbreaks, GBS neg.

## 2021-05-27 NOTE — Anesthesia Preprocedure Evaluation (Signed)
Anesthesia Evaluation  Patient identified by MRN, date of birth, ID band Patient awake    Reviewed: Allergy & Precautions, NPO status , Patient's Chart, lab work & pertinent test results  Airway Mallampati: II  TM Distance: >3 FB Neck ROM: Full    Dental no notable dental hx.    Pulmonary former smoker,  Quit smoking 2016   Pulmonary exam normal breath sounds clear to auscultation       Cardiovascular negative cardio ROS Normal cardiovascular exam Rhythm:Regular Rate:Normal     Neuro/Psych PSYCHIATRIC DISORDERS Anxiety negative neurological ROS     GI/Hepatic negative GI ROS, Neg liver ROS,   Endo/Other  Obesity BMI 33  Renal/GU negative Renal ROS  negative genitourinary   Musculoskeletal negative musculoskeletal ROS (+)   Abdominal (+) + obese,   Peds negative pediatric ROS (+)  Hematology negative hematology ROS (+) hct 38.5, plt 176   Anesthesia Other Findings   Reproductive/Obstetrics (+) Pregnancy                             Anesthesia Physical Anesthesia Plan  ASA: 2  Anesthesia Plan: Epidural   Post-op Pain Management:    Induction:   PONV Risk Score and Plan: 2  Airway Management Planned: Natural Airway  Additional Equipment: None  Intra-op Plan:   Post-operative Plan:   Informed Consent: I have reviewed the patients History and Physical, chart, labs and discussed the procedure including the risks, benefits and alternatives for the proposed anesthesia with the patient or authorized representative who has indicated his/her understanding and acceptance.       Plan Discussed with:   Anesthesia Plan Comments:         Anesthesia Quick Evaluation

## 2021-05-27 NOTE — Anesthesia Procedure Notes (Signed)
Epidural Patient location during procedure: OB Start time: 05/27/2021 8:25 PM End time: 05/27/2021 8:33 PM  Staffing Anesthesiologist: Lannie Fields, DO Performed: anesthesiologist   Preanesthetic Checklist Completed: patient identified, IV checked, risks and benefits discussed, monitors and equipment checked, pre-op evaluation and timeout performed  Epidural Patient position: sitting Prep: DuraPrep and site prepped and draped Patient monitoring: continuous pulse ox, blood pressure, heart rate and cardiac monitor Approach: midline Location: L3-L4 Injection technique: LOR air  Needle:  Needle type: Tuohy  Needle gauge: 17 G Needle length: 9 cm Needle insertion depth: 7 cm Catheter type: closed end flexible Catheter size: 19 Gauge Catheter at skin depth: 12 cm Test dose: negative  Assessment Sensory level: T8 Events: blood not aspirated, injection not painful, no injection resistance, no paresthesia and negative IV test  Additional Notes Patient identified. Risks/Benefits/Options discussed with patient including but not limited to bleeding, infection, nerve damage, paralysis, failed block, incomplete pain control, headache, blood pressure changes, nausea, vomiting, reactions to medication both or allergic, itching and postpartum back pain. Confirmed with bedside nurse the patient's most recent platelet count. Confirmed with patient that they are not currently taking any anticoagulation, have any bleeding history or any family history of bleeding disorders. Patient expressed understanding and wished to proceed. All questions were answered. Sterile technique was used throughout the entire procedure. Please see nursing notes for vital signs. Test dose was given through epidural catheter and negative prior to continuing to dose epidural or start infusion. Warning signs of high block given to the patient including shortness of breath, tingling/numbness in hands, complete motor  block, or any concerning symptoms with instructions to call for help. Patient was given instructions on fall risk and not to get out of bed. All questions and concerns addressed with instructions to call with any issues or inadequate analgesia.  Reason for block:procedure for pain

## 2021-05-27 NOTE — Lactation Note (Signed)
This note was copied from a baby's chart. Lactation Consultation Note  Patient Name: Deborah Lozano [id number] Date: 05/27/2021 Reason for consult: L&D Initial assessment;Mother's request;Term Age:28 hours  Mom experienced with breastfeeding for 1 year. Infant assisted with latching at breast with signs of milk transfer.  Mom to receive further Lactation support on the floor from RN staff.   Maternal Data Has patient been taught Hand Expression?: Yes Does the patient have breastfeeding experience prior to this delivery?: Yes How long did the patient breastfeed?: 1 year  Feeding Mother's Current Feeding Choice: Breast Milk  LATCH Score Latch: Repeated attempts needed to sustain latch, nipple held in mouth throughout feeding, stimulation needed to elicit sucking reflex.  Audible Swallowing: A few with stimulation  Type of Nipple: Everted at rest and after stimulation  Comfort (Breast/Nipple): Soft / non-tender  Hold (Positioning): Assistance needed to correctly position infant at breast and maintain latch.  LATCH Score: 7   Lactation Tools Discussed/Used    Interventions Interventions: Breast feeding basics reviewed;Support pillows;Education;Assisted with latch;Skin to skin;Position options;Expressed milk;Breast massage;Hand express;Breast compression;Adjust position  Discharge    Consult Status Consult Status: Follow-up Date: 05/28/21 Follow-up type: In-patient    Arrayah Connors  Nicholson-Springer 05/27/2021, 11:41 PM

## 2021-05-28 ENCOUNTER — Encounter (HOSPITAL_COMMUNITY): Payer: Self-pay | Admitting: Obstetrics & Gynecology

## 2021-05-28 LAB — CBC
HCT: 34.7 % — ABNORMAL LOW (ref 36.0–46.0)
Hemoglobin: 11.5 g/dL — ABNORMAL LOW (ref 12.0–15.0)
MCH: 30.3 pg (ref 26.0–34.0)
MCHC: 33.1 g/dL (ref 30.0–36.0)
MCV: 91.3 fL (ref 80.0–100.0)
Platelets: 151 10*3/uL (ref 150–400)
RBC: 3.8 MIL/uL — ABNORMAL LOW (ref 3.87–5.11)
RDW: 14.2 % (ref 11.5–15.5)
WBC: 14.9 10*3/uL — ABNORMAL HIGH (ref 4.0–10.5)
nRBC: 0 % (ref 0.0–0.2)

## 2021-05-28 LAB — RPR: RPR Ser Ql: NONREACTIVE

## 2021-05-28 MED ORDER — DIPHENHYDRAMINE HCL 25 MG PO CAPS: 25.0000 mg | ORAL_CAPSULE | Freq: Four times a day (QID) | ORAL | Status: DC | PRN

## 2021-05-28 MED ORDER — COCONUT OIL OIL: 1.0000 "application " | TOPICAL_OIL | Status: DC | PRN

## 2021-05-28 MED ORDER — ACETAMINOPHEN 325 MG PO TABS
650.0000 mg | ORAL_TABLET | ORAL | Status: DC | PRN
  Administered 2021-05-28 – 2021-05-29 (×4): 650 mg via ORAL
  Filled 2021-05-28 (×4): qty 2

## 2021-05-28 MED ORDER — ONDANSETRON HCL 4 MG/2ML IJ SOLN: 4.0000 mg | INTRAMUSCULAR | Status: DC | PRN

## 2021-05-28 MED ORDER — LORATADINE 10 MG PO TABS
10.0000 mg | ORAL_TABLET | Freq: Every day | ORAL | Status: DC
Start: 2021-05-28 — End: 2021-05-29
  Administered 2021-05-28 – 2021-05-29 (×2): 10 mg via ORAL
  Filled 2021-05-28 (×2): qty 1

## 2021-05-28 MED ORDER — ONDANSETRON HCL 4 MG PO TABS: 4.0000 mg | ORAL_TABLET | ORAL | Status: DC | PRN

## 2021-05-28 MED ORDER — SENNOSIDES-DOCUSATE SODIUM 8.6-50 MG PO TABS
2.0000 | ORAL_TABLET | ORAL | Status: DC
  Administered 2021-05-28 – 2021-05-29 (×2): 2 via ORAL
  Filled 2021-05-28 (×2): qty 2

## 2021-05-28 MED ORDER — SIMETHICONE 80 MG PO CHEW: 80.0000 mg | CHEWABLE_TABLET | ORAL | Status: DC | PRN

## 2021-05-28 MED ORDER — IBUPROFEN 600 MG PO TABS
600.0000 mg | ORAL_TABLET | Freq: Four times a day (QID) | ORAL | Status: DC
  Administered 2021-05-28 – 2021-05-29 (×5): 600 mg via ORAL
  Filled 2021-05-28 (×5): qty 1

## 2021-05-28 MED ORDER — WITCH HAZEL-GLYCERIN EX PADS: 1.0000 "application " | MEDICATED_PAD | CUTANEOUS | Status: DC | PRN

## 2021-05-28 MED ORDER — PRENATAL MULTIVITAMIN CH
1.0000 | ORAL_TABLET | Freq: Every day | ORAL | Status: DC
  Administered 2021-05-28 – 2021-05-29 (×2): 1 via ORAL
  Filled 2021-05-28 (×2): qty 1

## 2021-05-28 MED ORDER — BENZOCAINE-MENTHOL 20-0.5 % EX AERO: 1.0000 "application " | INHALATION_SPRAY | CUTANEOUS | Status: DC | PRN

## 2021-05-28 MED ORDER — TETANUS-DIPHTH-ACELL PERTUSSIS 5-2.5-18.5 LF-MCG/0.5 IM SUSY: 0.5000 mL | PREFILLED_SYRINGE | Freq: Once | INTRAMUSCULAR | Status: DC

## 2021-05-28 MED ORDER — DIBUCAINE (PERIANAL) 1 % EX OINT: 1.0000 "application " | TOPICAL_OINTMENT | CUTANEOUS | Status: DC | PRN

## 2021-05-28 NOTE — Progress Notes (Signed)
PPD# 1 SVD w/ intact perineum Information for the patient's newborn:  Koula, Venier [[id number]]  female   Baby Girl    S:   Reports feeling "really good, occasional cramping" Tolerating PO fluid and solids No nausea or vomiting Bleeding is light Pain controlled with acetaminophen and ibuprofen (OTC) Up ad lib / ambulatory / voiding w/o difficulty Feeding: Breast    O:   VS: BP 121/61 (BP Location: Right Arm)   Pulse 65   Temp 98.2 F (36.8 C)   Resp 20   SpO2 100%   Breastfeeding Unknown   LABS:  Recent Labs    05/27/21 1955 05/28/21 0351  WBC 10.6* 14.9*  HGB 12.7 11.5*  PLT 176 151   Blood type: --/--/O POS (07/22 1955) Rubella: Immune (12/20 0000)                      I&O: Intake/Output      07/22 0701 07/23 0700 07/23 0701 07/24 0700   Urine 150    Blood 150    Total Output 300    Net -300         Urine Occurrence 2 x      Physical Exam: Alert and oriented X3 Lungs: Clear and unlabored Heart: regular rate and rhythm / no mumurs Abdomen: soft, non-tender, non-distended  Fundus: firm, non-tender, U-1 Perineum: intact Lochia: minimal Extremities: negative edema, no calf pain or tenderness    A:  PPD # 1  Normal exam  P:  Routine post partum orders  Anticipate D/C on 05/29/21   Plan reviewed w/ Dr. Dorice Lamas, MSN, CNM 05/28/2021, 4:11 PM

## 2021-05-28 NOTE — Anesthesia Postprocedure Evaluation (Signed)
Anesthesia Post Note  Patient: Deborah Lozano  Procedure(s) Performed: AN AD HOC LABOR EPIDURAL     Patient location during evaluation: Mother Baby Anesthesia Type: Epidural Level of consciousness: awake and alert, oriented and patient cooperative Pain management: pain level controlled Vital Signs Assessment: post-procedure vital signs reviewed and stable Respiratory status: spontaneous breathing Cardiovascular status: stable Postop Assessment: no headache, epidural receding, patient able to bend at knees and no signs of nausea or vomiting Anesthetic complications: no Comments: Pt. States she is walking.  Pain score 1.    No notable events documented.  Last Vitals:  Vitals:   05/28/21 0220 05/28/21 0635  BP: 120/60 110/73  Pulse: 83 86  Resp: 19 18  Temp: 37.3 C 36.7 C  SpO2: 100% 100%    Last Pain:  Vitals:   05/28/21 0635  TempSrc: Oral  PainSc:    Pain Goal:                   [facility]

## 2021-05-29 MED ORDER — ACETAMINOPHEN 325 MG PO TABS: 650.0000 mg | ORAL_TABLET | ORAL | Status: DC | PRN

## 2021-05-29 MED ORDER — IBUPROFEN 600 MG PO TABS: 600.0000 mg | ORAL_TABLET | Freq: Four times a day (QID) | ORAL | 0 refills | Status: DC

## 2021-05-29 NOTE — Discharge Summary (Signed)
SVD OB Discharge Summary  Patient Name: Deborah Lozano DOB: 03-20-1991 MRN: [id number]  Date of admission: 05/27/2021 Intrauterine pregnancy: [redacted]w[redacted]d   Admitting diagnosis: Normal labor [O80, Z37.9] Secondary diagnosis: None  Date of discharge: 05/29/2021    Discharge diagnosis: Term Pregnancy Delivered     Prenatal history: [id number]   EDC : 06/02/2021, Alternate EDD Entry  Prenatal care at [facility]  Primary provider : [facility] Prenatal course complicated by N/A  Prenatal Labs: ABO, Rh: --/--/O POS (07/22 1955) /  Antibody: NEG (07/22 1955) Rubella: Immune (12/20 0000)  / RPR: NON REACTIVE (07/22 1955)  HBsAg: Negative (12/20 0000)  HIV: Non-reactive (12/20 0000)  GBS: Negative/-- (06/28 0000)                                    Hospital course:  Onset of Labor With Vaginal Delivery      30 y.o. yo [id number] at [redacted]w[redacted]d was admitted in Latent Labor on 05/27/2021. Patient had an uncomplicated labor course as follows:  Membrane Rupture Time/Date: 10:57 PM ,05/27/2021   Delivery Method:Vaginal, Spontaneous  Episiotomy: None  Lacerations:  None  Patient had an uncomplicated postpartum course.  She is ambulating, tolerating a regular diet, passing flatus, and urinating well. Patient is discharged home in stable condition on 05/29/21.  Newborn Data: Birth date:05/27/2021  Birth time:11:01 PM  Gender:Female  Living status:Living  Apgars:9 ,9  Weight:3056 g  Delivering PROVIDER: Rhea Pink B                                                            Complications: None  Newborn Data: Live born female  Birth Weight: 6 lb 11.8 oz (3056 g) APGAR: 9, 9  Newborn Delivery   Birth date/time: 05/27/2021 23:01:00 Delivery type: Vaginal, Spontaneous      Baby Feeding: Breast Disposition:home with mother  Post partum procedures: N/A  Labs: Lab Results  Component Value Date   WBC 14.9 (H) 05/28/2021   HGB 11.5 (L) 05/28/2021   HCT 34.7 (L) 05/28/2021   MCV 91.3 05/28/2021   PLT 151  05/28/2021   CMP Latest Ref Rng & Units 09/17/2019  Glucose 65 - 99 mg/dL 86  BUN 6 - 20 mg/dL 10  Creatinine 6.59 - 9.35 mg/dL 7.01  Sodium 779 - 390 mmol/L 140  Potassium 3.5 - 5.2 mmol/L 4.3  Chloride 96 - 106 mmol/L 104  CO2 20 - 29 mmol/L 21  Calcium 8.7 - 10.2 mg/dL 9.5  Total Protein 6.0 - 8.5 g/dL 7.2  Total Bilirubin 0.0 - 1.2 mg/dL 0.5  Alkaline Phos 39 - 117 IU/L 80  AST 0 - 40 IU/L 15  ALT 0 - 32 IU/L 24    Physical Exam @ time of discharge:  Vitals:   05/28/21 0950 05/28/21 1500 05/28/21 2201 05/29/21 0454  BP: 121/61 138/65 118/70 133/82  Pulse: 65 73 71 68  Resp: 20 20 20 20   Temp: 98.2 F (36.8 C) 98.1 F (36.7 C) 98.2 F (36.8 C) 97.9 F (36.6 C)  TempSrc:   Oral Oral  SpO2:   100% 100%   general: alert, cooperative, and no distress lochia: appropriate uterine fundus: firm perineum: intact incision: N/A extremities:  DVT Evaluation: No evidence of DVT seen on physical exam. Negative Homan's sign. No cords or calf tenderness. No significant calf/ankle edema.  Discharge instructions:  "Baby and Me Booklet"  Discharge Medications:  Allergies as of 05/29/2021   No Known Allergies      Medication List     STOP taking these medications    cetirizine 10 MG tablet Commonly known as: ZYRTEC   loratadine 10 MG tablet Commonly known as: CLARITIN   prenatal multivitamin Tabs tablet   valACYclovir 1000 MG tablet Commonly known as: VALTREX   valACYclovir 500 MG tablet Commonly known as: VALTREX       TAKE these medications    acetaminophen 325 MG tablet Commonly known as: Tylenol Take 2 tablets (650 mg total) by mouth every 4 (four) hours as needed (for pain scale < 4).   ibuprofen 600 MG tablet Commonly known as: ADVIL Take 1 tablet (600 mg total) by mouth every 6 (six) hours.       Diet: routine diet Activity: Advance as tolerated. Pelvic rest x 6 weeks.  Follow up:6 weeks  Signed: Carollee Leitz MSN, CNM 05/29/2021,  9:54 AM

## 2021-06-08 ENCOUNTER — Telehealth (HOSPITAL_COMMUNITY): Payer: Self-pay | Admitting: *Deleted

## 2021-06-08 NOTE — Telephone Encounter (Signed)
Hospital discharge follow-up call attempted. Left message for patient to return RN call. Deforest Hoyles, RN, 06/08/21, [phone].

## 2021-07-05 ENCOUNTER — Encounter: Payer: Medicaid Other | Admitting: Registered Nurse

## 2021-08-29 ENCOUNTER — Ambulatory Visit: Payer: Medicaid Other | Admitting: Psychology

## 2021-09-10 ENCOUNTER — Ambulatory Visit (HOSPITAL_COMMUNITY)
Admission: EM | Admit: 2021-09-10 | Discharge: 2021-09-10 | Disposition: A | Discharge status: Home / Self Care | Payer: No Typology Code available for payment source

## 2021-09-10 ENCOUNTER — Emergency Department (HOSPITAL_BASED_OUTPATIENT_CLINIC_OR_DEPARTMENT_OTHER)
Admission: EM | Admit: 2021-09-10 | Discharge: 2021-09-11 | Disposition: A | Discharge status: Home / Self Care | Payer: No Typology Code available for payment source | Attending: Emergency Medicine | Admitting: Emergency Medicine

## 2021-09-10 ENCOUNTER — Other Ambulatory Visit: Payer: Self-pay

## 2021-09-10 ENCOUNTER — Encounter (HOSPITAL_COMMUNITY): Payer: Self-pay

## 2021-09-10 DIAGNOSIS — D72829 Elevated white blood cell count, unspecified: Secondary | ICD-10-CM | POA: Diagnosis not present

## 2021-09-10 DIAGNOSIS — R1030 Lower abdominal pain, unspecified: Secondary | ICD-10-CM | POA: Diagnosis present

## 2021-09-10 DIAGNOSIS — N739 Female pelvic inflammatory disease, unspecified: Secondary | ICD-10-CM | POA: Insufficient documentation

## 2021-09-10 DIAGNOSIS — N730 Acute parametritis and pelvic cellulitis: Secondary | ICD-10-CM

## 2021-09-10 DIAGNOSIS — Z87891 Personal history of nicotine dependence: Secondary | ICD-10-CM | POA: Diagnosis not present

## 2021-09-10 DIAGNOSIS — R509 Fever, unspecified: Secondary | ICD-10-CM | POA: Diagnosis not present

## 2021-09-10 DIAGNOSIS — Z20822 Contact with and (suspected) exposure to covid-19: Secondary | ICD-10-CM | POA: Diagnosis not present

## 2021-09-10 LAB — COMPREHENSIVE METABOLIC PANEL
ALT: 15 U/L (ref 0–44)
AST: 11 U/L — ABNORMAL LOW (ref 15–41)
Albumin: 4 g/dL (ref 3.5–5.0)
Alkaline Phosphatase: 43 U/L (ref 38–126)
Anion gap: 11 (ref 5–15)
BUN: 6 mg/dL (ref 6–20)
CO2: 23 mmol/L (ref 22–32)
Calcium: 9.2 mg/dL (ref 8.9–10.3)
Chloride: 102 mmol/L (ref 98–111)
Creatinine, Ser: 0.71 mg/dL (ref 0.44–1.00)
GFR, Estimated: >60 mL/min (ref >60)
Glucose, Bld: 82 mg/dL (ref 70–99)
Potassium: 3.2 mmol/L — ABNORMAL LOW (ref 3.5–5.1)
Sodium: 136 mmol/L (ref 135–145)
Total Bilirubin: 1 mg/dL (ref 0.3–1.2)
Total Protein: 7.8 g/dL (ref 6.5–8.1)

## 2021-09-10 LAB — RESP PANEL BY RT-PCR (FLU A&B, COVID) ARPGX2
Influenza A by PCR: NEGATIVE
Influenza B by PCR: NEGATIVE
SARS Coronavirus 2 by RT PCR: NEGATIVE

## 2021-09-10 LAB — WET PREP, GENITAL
Clue Cells Wet Prep HPF POC: NONE SEEN
Sperm: NONE SEEN
Trich, Wet Prep: NONE SEEN
Yeast Wet Prep HPF POC: NONE SEEN

## 2021-09-10 LAB — POCT URINALYSIS DIPSTICK, ED / UC
Glucose, UA: NEGATIVE mg/dL
Ketones, ur: >=160 mg/dL — AB
Nitrite: NEGATIVE
Protein, ur: 100 mg/dL — AB
Specific Gravity, Urine: 1.025 (ref 1.005–1.030)
Urobilinogen, UA: 2 mg/dL — ABNORMAL HIGH (ref 0.0–1.0)
pH: 6 (ref 5.0–8.0)

## 2021-09-10 LAB — POC INFLUENZA A AND B ANTIGEN (URGENT CARE ONLY)
INFLUENZA A ANTIGEN, POC: NEGATIVE
INFLUENZA B ANTIGEN, POC: NEGATIVE

## 2021-09-10 LAB — CBC
HCT: 34 % — ABNORMAL LOW (ref 36.0–46.0)
Hemoglobin: 11.5 g/dL — ABNORMAL LOW (ref 12.0–15.0)
MCH: 29.4 pg (ref 26.0–34.0)
MCHC: 33.8 g/dL (ref 30.0–36.0)
MCV: 87 fL (ref 80.0–100.0)
Platelets: 275 10*3/uL (ref 150–400)
RBC: 3.91 MIL/uL (ref 3.87–5.11)
RDW: 13.2 % (ref 11.5–15.5)
WBC: 13.6 10*3/uL — ABNORMAL HIGH (ref 4.0–10.5)
nRBC: 0 % (ref 0.0–0.2)

## 2021-09-10 LAB — POC URINE PREG, ED: Preg Test, Ur: NEGATIVE

## 2021-09-10 LAB — LIPASE, BLOOD: Lipase: 18 U/L (ref 11–51)

## 2021-09-10 MED ORDER — METRONIDAZOLE 500 MG/100ML IV SOLN
500.0000 mg | Freq: Once | INTRAVENOUS | Status: AC
  Administered 2021-09-11: 500 mg via INTRAVENOUS
  Filled 2021-09-10: qty 100

## 2021-09-10 MED ORDER — SODIUM CHLORIDE 0.9 % IV SOLN
1.0000 g | Freq: Once | INTRAVENOUS | Status: AC
  Administered 2021-09-11: 1 g via INTRAVENOUS
  Filled 2021-09-10: qty 10

## 2021-09-10 MED ORDER — ACETAMINOPHEN 325 MG PO TABS
975.0000 mg | ORAL_TABLET | Freq: Once | ORAL | Status: AC
  Administered 2021-09-10: 975 mg via ORAL

## 2021-09-10 MED ORDER — ONDANSETRON HCL 4 MG/2ML IJ SOLN
4.0000 mg | Freq: Once | INTRAMUSCULAR | Status: AC
  Administered 2021-09-10: 4 mg via INTRAVENOUS
  Filled 2021-09-10: qty 2

## 2021-09-10 MED ORDER — HYDROMORPHONE HCL 1 MG/ML IJ SOLN
1.0000 mg | Freq: Once | INTRAMUSCULAR | Status: AC
  Administered 2021-09-10: 1 mg via INTRAVENOUS
  Filled 2021-09-10: qty 1

## 2021-09-10 MED ORDER — ACETAMINOPHEN 325 MG PO TABS
ORAL_TABLET | ORAL | Status: AC
  Filled 2021-09-10: qty 3

## 2021-09-10 MED ORDER — SODIUM CHLORIDE 0.9 % IV BOLUS
1000.0000 mL | Freq: Once | INTRAVENOUS | Status: AC
  Administered 2021-09-11: 1000 mL via INTRAVENOUS

## 2021-09-10 NOTE — ED Triage Notes (Signed)
Pt reports lower abdominal pain and chills x 5 days.

## 2021-09-10 NOTE — ED Notes (Signed)
ED Provider at bedside. 

## 2021-09-10 NOTE — ED Notes (Signed)
Urine already collected for UA and Upreg at [facility] today; results in chart.

## 2021-09-10 NOTE — ED Triage Notes (Signed)
Pt POV c/o lower abd pain, diarrhea since Monday. Was seen at [facility] and tested for UTI today. Pt reports feeling dehydrated. Poor PO intake since Monday.  Pt was given tylenol UC appx 1900 today for fever.

## 2021-09-10 NOTE — ED Provider Notes (Addendum)
[facility]   CSN: [id number] Arrival date & time: 09/10/21  [id number]     History Chief Complaint  Patient presents with   Abdominal Pain   Fever    [name] is a 30 y.o. female.  Patient seen at urgent care earlier today.  Sent here for imaging.  Patient with a complaint of lower abdominal pain since Monday.  Early for the first few days had diarrhea.  No diarrhea since.  Patient with fever and feeling dehydrated poor p.o. intake since Monday.  Pain is all below the umbilicus.  Patient is on Depo and has been having vaginal spotting.  That is been going on long-term.  Does have an OB/GYN to follow-up with.  Patient is also had chills.  Patient's temp upon arrival here is been 100.9.  Blood pressure is good 141/77 heart rate 114 but now down to 99 respiration 16.  Urgent care did a pregnancy test that was negative and also did a urinalysis which was negative for UTI.      Past Medical History:  Diagnosis Date   Carpal tunnel syndrome during pregnancy     Patient Active Problem List   Diagnosis Date Noted   Normal labor 05/27/2021   SVD (7/22) 05/27/2021   HSV-2 seropositive 02/03/2020    Past Surgical History:  Procedure Laterality Date   NO PAST SURGERIES       OB History     Gravida  4   Para  3   Term  3   Preterm      AB  1   Living  3      SAB  1   IAB      Ectopic      Multiple  0   Live Births  3           Family History  Problem Relation Age of Onset   Cancer Paternal Aunt    Diabetes Paternal Aunt    Diabetes Maternal Grandfather    Diabetes Paternal Grandmother    Alcohol abuse Neg Hx    Arthritis Neg Hx    Asthma Neg Hx    Birth defects Neg Hx    COPD Neg Hx    Depression Neg Hx    Drug abuse Neg Hx    Early death Neg Hx    Hearing loss Neg Hx    Heart disease Neg Hx    Hyperlipidemia Neg Hx    Hypertension Neg Hx    Kidney disease Neg Hx    Learning disabilities Neg Hx     Mental illness Neg Hx    Mental retardation Neg Hx    Miscarriages / Stillbirths Neg Hx    Stroke Neg Hx    Vision loss Neg Hx    Varicose Veins Neg Hx     Social History   Tobacco Use   Smoking status: Former    Types: Cigarettes    Quit date: 06/07/2015    Years since quitting: 6.2   Smokeless tobacco: Never  Vaping Use   Vaping Use: Never used  Substance Use Topics   Alcohol use: Yes    Comment: 2 glassess of wine a week   Drug use: No    Home Medications Prior to Admission medications   Medication Sig Start Date End Date Taking? Authorizing Provider  acetaminophen (TYLENOL) 325 MG tablet Take 2 tablets (650 mg total) by mouth every 4 (four) hours as needed (for  pain scale < 4). 05/29/21   Holshouser, Gerhard Munch, CNM  ibuprofen (ADVIL) 600 MG tablet Take 1 tablet (600 mg total) by mouth every 6 (six) hours. 05/29/21   Holshouser, Gerhard Munch, CNM  medroxyPROGESTERone (DEPO-PROVERA) 150 MG/ML injection Inject 150 mg into the muscle every 3 (three) months.    [provider]  norelgestromin-ethinyl estradiol (ORTHO EVRA) 150-35 MCG/24HR transdermal patch Place 1 patch onto the skin once a week. For 3 weeks, then 1 week without patch 09/17/19 01/09/20  Janeece Agee, NP    Allergies    Patient has no known allergies.  Review of Systems   Review of Systems  Constitutional:  Positive for appetite change, chills and fever.  HENT:  Positive for tinnitus. Negative for ear pain and sore throat.   Eyes:  Negative for pain and visual disturbance.  Respiratory:  Negative for cough and shortness of breath.   Cardiovascular:  Negative for chest pain and palpitations.  Gastrointestinal:  Positive for abdominal pain and diarrhea. Negative for vomiting.  Genitourinary:  Positive for pelvic pain and vaginal bleeding. Negative for dysuria, hematuria and vaginal discharge.  Musculoskeletal:  Negative for arthralgias and back pain.  Skin:  Negative for color change and rash.   Neurological:  Negative for seizures and syncope.  All other systems reviewed and are negative.  Physical Exam Updated Vital Signs BP 126/74   Pulse 96   Temp (!) 100.9 F (38.3 C)   Resp 18   Ht 1.651 m (5\' 5" )   Wt 85.7 kg   SpO2 100%   BMI 31.45 kg/m   Physical Exam Vitals and nursing note reviewed.  Constitutional:      General: She is not in acute distress.    Appearance: Normal appearance. She is well-developed.  HENT:     Head: Normocephalic and atraumatic.     Mouth/Throat:     Mouth: Mucous membranes are dry.  Eyes:     Extraocular Movements: Extraocular movements intact.     Conjunctiva/sclera: Conjunctivae normal.     Pupils: Pupils are equal, round, and reactive to light.  Cardiovascular:     Rate and Rhythm: Normal rate and regular rhythm.     Heart sounds: No murmur heard. Pulmonary:     Effort: Pulmonary effort is normal. No respiratory distress.     Breath sounds: Normal breath sounds.  Abdominal:     General: There is no distension.     Palpations: Abdomen is soft.     Tenderness: There is abdominal tenderness. There is no guarding.     Comments: Tenderness to palpation below the umbilicus.  More so in the pelvic area.  No guarding.  Genitourinary:    Vagina: Vaginal discharge present.     Comments: Patient with significant vaginal purulent type discharge.  Coming from the cervical os.  External genitalia normal.  Mild cervical motion tenderness.  Significant uterine tenderness and right adnexal tenderness. Musculoskeletal:        General: Normal range of motion.     Cervical back: Normal range of motion and neck supple.  Skin:    General: Skin is warm and dry.     Capillary Refill: Capillary refill takes less than 2 seconds.  Neurological:     General: No focal deficit present.     Mental Status: She is alert and oriented to person, place, and time.     Cranial Nerves: No cranial nerve deficit.     Sensory: No sensory deficit.  ED Results  / Procedures / Treatments   Labs (all labs ordered are listed, but only abnormal results are displayed) Labs Reviewed  COMPREHENSIVE METABOLIC PANEL - Abnormal; Notable for the following components:      Result Value   Potassium 3.2 (*)    AST 11 (*)    All other components within normal limits  CBC - Abnormal; Notable for the following components:   WBC 13.6 (*)    Hemoglobin 11.5 (*)    HCT 34.0 (*)    All other components within normal limits  RESP PANEL BY RT-PCR (FLU A&B, COVID) ARPGX2  WET PREP, GENITAL  LIPASE, BLOOD  RPR  HIV ANTIBODY (ROUTINE TESTING W REFLEX)  RAPID HIV SCREEN (HIV 1/2 AB+AG)  GC/CHLAMYDIA PROBE AMP ([place]) NOT AT [facility]    EKG None  Radiology No results found.  Procedures Procedures   Medications Ordered in ED Medications  sodium chloride 0.9 % bolus 1,000 mL (has no administration in time range)  ondansetron (ZOFRAN) injection 4 mg (has no administration in time range)  HYDROmorphone (DILAUDID) injection 1 mg (has no administration in time range)  cefTRIAXone (ROCEPHIN) 1 g in sodium chloride 0.9 % 100 mL IVPB (has no administration in time range)  metroNIDAZOLE (FLAGYL) IVPB 500 mg (has no administration in time range)    ED Course  I have reviewed the triage vital signs and the nursing notes.  Pertinent labs & imaging results that were available during my care of the patient were reviewed by me and considered in my medical decision making (see chart for details).    MDM Rules/Calculators/A&P                           Patient clinically with purulent discharge from the cervix.  Uterine tenderness.  Some concern for significant tenderness to the right adnexal area.  Will get ultrasound to rule out tubo-ovarian abscess.  Patient's had fever and chills.  But not overall toxic.  Appears little dehydrated.  Patient's labs are significant for leukocytosis with a white count of 13.6.  Hemoglobin is 11.5.  LFTs are normal.  Complete  metabolic panel otherwise without any renal function abnormalities.  Glucose is normal.  And potassium is 3.2 other some mild hyponatremia.  COVID and influenza testing is negative.  Wet prep still pending cultures and RPR have been done.  I will go ahead and give a dose of IV ceftriaxone here and Flagyl.  Patient will be treated with oral doxycycline and Flagyl.  As if it could be PID.  Clinically there is some concern may be for tubo-ovarian abscess.  But ultrasound is not currently available here.  Patient was already at the urgent care at [facility].  Unlikely to send her back to [facility] for an ultrasound.  I think we will treat her for PID and then she can follow-up with her OB/GYN.  Patient does have OB/GYN to follow-up with next week.     Final Clinical Impression(s) / ED Diagnoses Final diagnoses:  PID (acute pelvic inflammatory disease)    Rx / DC Orders ED Discharge Orders     None        Fredia Sorrow, MD 09/10/21 2336    Fredia Sorrow, MD 09/10/21 2337

## 2021-09-11 DIAGNOSIS — N739 Female pelvic inflammatory disease, unspecified: Secondary | ICD-10-CM | POA: Diagnosis not present

## 2021-09-11 LAB — HIV ANTIBODY (ROUTINE TESTING W REFLEX): HIV Screen 4th Generation wRfx: NONREACTIVE

## 2021-09-11 MED ORDER — DOXYCYCLINE HYCLATE 100 MG PO CAPS: 100.0000 mg | ORAL_CAPSULE | Freq: Two times a day (BID) | ORAL | 0 refills | Status: AC

## 2021-09-11 MED ORDER — METRONIDAZOLE 500 MG PO TABS: 500.0000 mg | ORAL_TABLET | Freq: Two times a day (BID) | ORAL | 0 refills | Status: AC

## 2021-09-11 MED ORDER — IBUPROFEN 800 MG PO TABS
800.0000 mg | ORAL_TABLET | Freq: Once | ORAL | Status: AC
  Administered 2021-09-11: 800 mg via ORAL
  Filled 2021-09-11: qty 1

## 2021-09-11 NOTE — ED Notes (Signed)
  Spoke with pharmacy and advised patient to pump and dump breast milk due to Flagyl antibiotic being prescribed.  Instructed patient to follow up with OB/GYN and they may change her antibiotic depending on their preference.  Patient and mother both verbalized understanding.

## 2021-09-11 NOTE — Discharge Instructions (Addendum)
Make an appointment to follow-up with your OB/GYN as you have already scheduled for next week.  Take the antibiotics doxycycline and Flagyl as directed.  Can take Tylenol and/or Motrin as needed for the fever.  And for the abdominal pain.  Return for any new or worse symptoms.  Return if unable to keep the antibiotics down.  Work-up is consistent with pelvic inflammatory disease some unknown infection at this point in time.

## 2021-09-12 LAB — GC/CHLAMYDIA PROBE AMP (~~LOC~~) NOT AT ARMC
Chlamydia: NEGATIVE
Comment: NEGATIVE
Comment: NORMAL
Neisseria Gonorrhea: POSITIVE — AB

## 2021-09-12 LAB — RPR: RPR Ser Ql: NONREACTIVE

## 2021-09-14 NOTE — ED Provider Notes (Signed)
[place]    CSN: [id number] Arrival date & time: 09/10/21  1606      History   Chief Complaint Chief Complaint  Patient presents with   Abdominal Pain   Chills    HPI Deborah Lozano is a 30 y.o. female.   Presenting today with 5 day history of acutely worsening lower abdominal pain, fever, chills, diarrhea, intractible nausea and vomiting. States her abdominal pain is worst in the RLQ. Denies cough, congestion, dysuria, hematuria, rashes, sick contacts. So far trying fever reducers but states she cannot keep anything down to even take medication with. LMP unknown.    Past Medical History:  Diagnosis Date   Carpal tunnel syndrome during pregnancy     Patient Active Problem List   Diagnosis Date Noted   Normal labor 05/27/2021   SVD (7/22) 05/27/2021   HSV-2 seropositive 02/03/2020    Past Surgical History:  Procedure Laterality Date   NO PAST SURGERIES      OB History     Gravida  4   Para  3   Term  3   Preterm      AB  1   Living  3      SAB  1   IAB      Ectopic      Multiple  0   Live Births  3            Home Medications    Prior to Admission medications   Medication Sig Start Date End Date Taking? Authorizing Provider  medroxyPROGESTERone (DEPO-PROVERA) 150 MG/ML injection Inject 150 mg into the muscle every 3 (three) months.   Yes [provider]  acetaminophen (TYLENOL) 325 MG tablet Take 2 tablets (650 mg total) by mouth every 4 (four) hours as needed (for pain scale < 4). 05/29/21   Holshouser, Theone Murdoch, CNM  doxycycline (VIBRAMYCIN) 100 MG capsule Take 1 capsule (100 mg total) by mouth 2 (two) times daily for 14 days. 09/11/21 09/25/21  Fredia Sorrow, MD  ibuprofen (ADVIL) 600 MG tablet Take 1 tablet (600 mg total) by mouth every 6 (six) hours. 05/29/21   Holshouser, Theone Murdoch, CNM  metroNIDAZOLE (FLAGYL) 500 MG tablet Take 1 tablet (500 mg total) by mouth 2 (two) times daily for 14 days. 09/11/21  09/25/21  Fredia Sorrow, MD  norelgestromin-ethinyl estradiol (ORTHO EVRA) 150-35 MCG/24HR transdermal patch Place 1 patch onto the skin once a week. For 3 weeks, then 1 week without patch 09/17/19 01/09/20  Maximiano Coss, NP    Family History Family History  Problem Relation Age of Onset   Cancer Paternal Aunt    Diabetes Paternal Aunt    Diabetes Maternal Grandfather    Diabetes Paternal Grandmother    Alcohol abuse Neg Hx    Arthritis Neg Hx    Asthma Neg Hx    Birth defects Neg Hx    COPD Neg Hx    Depression Neg Hx    Drug abuse Neg Hx    Early death Neg Hx    Hearing loss Neg Hx    Heart disease Neg Hx    Hyperlipidemia Neg Hx    Hypertension Neg Hx    Kidney disease Neg Hx    Learning disabilities Neg Hx    Mental illness Neg Hx    Mental retardation Neg Hx    Miscarriages / Stillbirths Neg Hx    Stroke Neg Hx    Vision loss Neg Hx  Varicose Veins Neg Hx     Social History Social History   Tobacco Use   Smoking status: Former    Types: Cigarettes    Quit date: 06/07/2015    Years since quitting: 6.2   Smokeless tobacco: Never  Vaping Use   Vaping Use: Never used  Substance Use Topics   Alcohol use: Yes    Comment: 2 glassess of wine a week   Drug use: No     Allergies   Patient has no known allergies.   Review of Systems Review of Systems PER HPI   Physical Exam Triage Vital Signs ED Triage Vitals  Enc Vitals Group     BP 09/10/21 1817 111/73     Pulse Rate 09/10/21 1814 (!) 118     Resp 09/10/21 1814 18     Temp 09/10/21 1814 (!) 103.1 F (39.5 C)     Temp Source 09/10/21 1814 Oral     SpO2 09/10/21 1814 98 %     Weight --      Height --      Head Circumference --      Peak Flow --      Pain Score 09/10/21 1813 10     Pain Loc --      Pain Edu? --      Excl. in GC? --    No data found.  Updated Vital Signs BP 111/73 (BP Location: Left Arm)   Pulse (!) 118   Temp (!) 103.1 F (39.5 C) (Oral)   Resp 18   SpO2 98%    Visual Acuity Right Eye Distance:   Left Eye Distance:   Bilateral Distance:    Right Eye Near:   Left Eye Near:    Bilateral Near:     Physical Exam Vitals and nursing note reviewed.  Constitutional:      Appearance: She is ill-appearing.     Comments: Tearful, diaphoretic, appears in significant pain  HENT:     Head: Atraumatic.  Eyes:     Extraocular Movements: Extraocular movements intact.     Conjunctiva/sclera: Conjunctivae normal.  Cardiovascular:     Rate and Rhythm: Regular rhythm. Tachycardia present.     Heart sounds: Normal heart sounds.  Pulmonary:     Effort: Pulmonary effort is normal.     Breath sounds: Normal breath sounds.  Abdominal:     General: There is no distension.     Palpations: Abdomen is soft.     Tenderness: There is abdominal tenderness (significant lower abdominal ttp, worst RLQ. Guarding in this area). There is guarding. There is no right CVA tenderness, left CVA tenderness or rebound.     Comments: Lower bowel sounds mildly hyperactive  Musculoskeletal:        General: Normal range of motion.     Cervical back: Normal range of motion and neck supple.  Skin:    General: Skin is warm and dry.  Neurological:     Mental Status: She is alert and oriented to person, place, and time.  Psychiatric:        Mood and Affect: Mood normal.        Thought Content: Thought content normal.        Judgment: Judgment normal.     UC Treatments / Results  Labs (all labs ordered are listed, but only abnormal results are displayed) Labs Reviewed  POCT URINALYSIS DIPSTICK, ED / UC - Abnormal; Notable for the following components:  Result Value   Bilirubin Urine MODERATE (*)    Ketones, ur >=160 (*)    Hgb urine dipstick MODERATE (*)    Protein, ur 100 (*)    Urobilinogen, UA 2.0 (*)    Leukocytes,Ua SMALL (*)    All other components within normal limits  POC URINE PREG, ED  POC INFLUENZA A AND B ANTIGEN (URGENT CARE ONLY)     EKG   Radiology No results found.  Procedures Procedures (including critical care time)  Medications Ordered in UC Medications  acetaminophen (TYLENOL) tablet 975 mg (975 mg Oral Given 09/10/21 1829)    Initial Impression / Assessment and Plan / UC Course  I have reviewed the triage vital signs and the nursing notes.  Pertinent labs & imaging results that were available during my care of the patient were reviewed by me and considered in my medical decision making (see chart for details).     Significantly febrile in triage with associated tachycardia, this was improved slightly with tylenol. U/A without evidence of UTI/pyelo, rapid flu neg, and patient in 10/10 abdominal pain in lower abdomen. Discussed transport to ED for further immediate evaluation of her sxs, she is agreeable and wishes to go via private vehicle for evaluation. She is currently hemodynamically stable for transport to ED for further evaluation.   Final Clinical Impressions(s) / UC Diagnoses   Final diagnoses:  Lower abdominal pain  Fever, unspecified fever cause   Discharge Instructions   None    ED Prescriptions   None    PDMP not reviewed this encounter.   Volney American, [place] 09/14/21 2318

## 2021-11-17 ENCOUNTER — Encounter: Payer: Self-pay | Admitting: Registered Nurse

## 2021-11-17 DIAGNOSIS — F4323 Adjustment disorder with mixed anxiety and depressed mood: Secondary | ICD-10-CM | POA: Insufficient documentation

## 2021-11-28 ENCOUNTER — Encounter: Payer: No Typology Code available for payment source | Admitting: Registered Nurse

## 2022-03-30 ENCOUNTER — Encounter: Payer: No Typology Code available for payment source | Admitting: Registered Nurse

## 2022-04-05 ENCOUNTER — Other Ambulatory Visit: Payer: Self-pay

## 2022-04-05 ENCOUNTER — Ambulatory Visit (INDEPENDENT_AMBULATORY_CARE_PROVIDER_SITE_OTHER): Payer: No Typology Code available for payment source | Admitting: Registered Nurse

## 2022-04-05 VITALS — BP 124/76 | HR 77 | Temp 97.9°F | Resp 18 | Ht 65.0 in | Wt 177.8 lb

## 2022-04-05 DIAGNOSIS — Z0000 Encounter for general adult medical examination without abnormal findings: Secondary | ICD-10-CM | POA: Diagnosis not present

## 2022-04-05 NOTE — Patient Instructions (Addendum)
Ms Deborah Lozano to see you!  Call with any concerns, otherwise, see you in a year for a physical and labs (and I won't be running late that time!)  Thank you,  Rich     If you have lab work done today you will be contacted with your lab results within the next 2 weeks.  If you have not heard from [place] then please contact us. The fastest way to get your results is to register for My Chart.   IF you received an x-ray today, you will receive an invoice from [facility]. Please contact [facility] at [phone] with questions or concerns regarding your invoice.   IF you received labwork today, you will receive an invoice from [facility]. Please contact [facility] at [phone] with questions or concerns regarding your invoice.   Our billing staff will not be able to assist you with questions regarding bills from these companies.  You will be contacted with the lab results as soon as they are available. The fastest way to get your results is to activate your My Chart account. Instructions are located on the last page of this paperwork. If you have not heard from [place] regarding the results in 2 weeks, please contact this office.

## 2022-04-06 ENCOUNTER — Encounter: Payer: Self-pay | Admitting: Registered Nurse

## 2022-04-06 NOTE — Progress Notes (Signed)
Complete physical exam  Patient: Deborah Lozano   DOB: 07-04-1991   30 y.o. Female  MRN: [id number] Visit Date: 04/06/2022  Subjective:   No chief complaint on file.   Deborah Lozano is a 31 y.o. female who presents today for a complete physical exam. She reports consuming a general diet. Home exercise routine includes walking 4 hrs per week. She generally feels well. She reports sleeping well. She does not have additional problems to discuss today.   Vision:Within the last year Dental:Within Last 6 months STD Screen:No PSA:No Most recent fall risk assessment:    04/05/2022    2:43 PM  Fall Risk   Falls in the past year? 0  Number falls in past yr: 0  Injury with Fall? 0  Risk for fall due to : No Fall Risks  Follow up Falls evaluation completed     Most recent depression screenings:    02/03/2020   10:38 AM 09/17/2019    9:11 AM  PHQ 2/9 Scores  PHQ - 2 Score 0 0     Patient Active Problem List   Diagnosis Date Noted   Adjustment disorder with mixed anxiety and depressed mood 11/17/2021   Normal labor 05/27/2021   SVD (7/22) 05/27/2021   HSV-2 seropositive 02/03/2020   Past Medical History:  Diagnosis Date   Carpal tunnel syndrome during pregnancy    Past Surgical History:  Procedure Laterality Date   NO PAST SURGERIES     Social History   Tobacco Use   Smoking status: Former    Types: Cigarettes    Quit date: 06/07/2015    Years since quitting: 6.8   Smokeless tobacco: Never  Vaping Use   Vaping Use: Never used  Substance Use Topics   Alcohol use: Yes    Comment: 2 glassess of wine a week   Drug use: No   Social History   Socioeconomic History   Marital status: Single    Spouse name: Not on file   Number of children: 2   Years of education: Not on file   Highest education level: Not on file  Occupational History   Not on file  Tobacco Use   Smoking status: Former    Types: Cigarettes    Quit date: 06/07/2015    Years since quitting: 6.8    Smokeless tobacco: Never  Vaping Use   Vaping Use: Never used  Substance and Sexual Activity   Alcohol use: Yes    Comment: 2 glassess of wine a week   Drug use: No   Sexual activity: Yes    Comment: last IC 09/17/18  Other Topics Concern   Not on file  Social History Narrative   Not on file   Social Determinants of Health   Financial Resource Strain: Not on file  Food Insecurity: Not on file  Transportation Needs: Not on file  Physical Activity: Not on file  Stress: Not on file  Social Connections: Not on file  Intimate Partner Violence: Not on file   Family Status  Relation Name Status   Emelda Brothers  (Not Specified)   MGF  Deceased   PGM  (Not Specified)   Mother  Alive   Father  Alive   Neg Hx  (Not Specified)   Family History  Problem Relation Age of Onset   Cancer Paternal Aunt    Diabetes Paternal Aunt    Diabetes Maternal Grandfather    Diabetes Paternal Grandmother    Alcohol abuse  Neg Hx    Arthritis Neg Hx    Asthma Neg Hx    Birth defects Neg Hx    COPD Neg Hx    Depression Neg Hx    Drug abuse Neg Hx    Early death Neg Hx    Hearing loss Neg Hx    Heart disease Neg Hx    Hyperlipidemia Neg Hx    Hypertension Neg Hx    Kidney disease Neg Hx    Learning disabilities Neg Hx    Mental illness Neg Hx    Mental retardation Neg Hx    Miscarriages / Stillbirths Neg Hx    Stroke Neg Hx    Vision loss Neg Hx    Varicose Veins Neg Hx    No Known Allergies   Patient Care Team: Janeece AgeeMorrow, Susan Arana, NP as PCP - General (Adult Health Nurse Practitioner)   Medications: Outpatient Medications Prior to Visit  Medication Sig   acetaminophen (TYLENOL) 325 MG tablet Take 2 tablets (650 mg total) by mouth every 4 (four) hours as needed (for pain scale < 4).   ibuprofen (ADVIL) 600 MG tablet Take 1 tablet (600 mg total) by mouth every 6 (six) hours.   medroxyPROGESTERone (DEPO-PROVERA) 150 MG/ML injection Inject 150 mg into the muscle every 3 (three) months.    No facility-administered medications prior to visit.    Review of Systems  Constitutional: Negative.   HENT: Negative.    Eyes: Negative.   Respiratory: Negative.    Cardiovascular: Negative.   Gastrointestinal: Negative.   Genitourinary: Negative.   Musculoskeletal: Negative.   Skin: Negative.   Neurological: Negative.   Psychiatric/Behavioral: Negative.    All other systems reviewed and are negative.  Last CBC Lab Results  Component Value Date   WBC 13.6 (H) 09/10/2021   HGB 11.5 (L) 09/10/2021   HCT 34.0 (L) 09/10/2021   MCV 87.0 09/10/2021   MCH 29.4 09/10/2021   RDW 13.2 09/10/2021   PLT 275 09/10/2021   Last metabolic panel Lab Results  Component Value Date   GLUCOSE 82 09/10/2021   NA 136 09/10/2021   K 3.2 (L) 09/10/2021   CL 102 09/10/2021   CO2 23 09/10/2021   BUN 6 09/10/2021   CREATININE 0.71 09/10/2021   GFRNONAA >60 09/10/2021   CALCIUM 9.2 09/10/2021   PROT 7.8 09/10/2021   ALBUMIN 4.0 09/10/2021   LABGLOB 2.6 09/17/2019   AGRATIO 1.8 09/17/2019   BILITOT 1.0 09/10/2021   ALKPHOS 43 09/10/2021   AST 11 (L) 09/10/2021   ALT 15 09/10/2021   ANIONGAP 11 09/10/2021   Last lipids Lab Results  Component Value Date   CHOL 133 09/17/2019   HDL 43 09/17/2019   LDLCALC 74 09/17/2019   TRIG 81 09/17/2019   CHOLHDL 3.1 09/17/2019   Last hemoglobin A1c Lab Results  Component Value Date   HGBA1C 5.1 09/17/2019   Last thyroid functions Lab Results  Component Value Date   TSH 1.010 09/17/2019   Last vitamin D No results found for: 25OHVITD2, 25OHVITD3, VD25OH Last vitamin B12 and Folate No results found for: VITAMINB12, FOLATE      Objective:     BP 124/76   Pulse 77   Temp 97.9 F (36.6 C) (Temporal)   Resp 18   Ht 5\' 5"  (1.651 m)   Wt 177 lb 12.8 oz (80.6 kg)   SpO2 98%   BMI 29.59 kg/m   BP Readings from Last 3 Encounters:  04/05/22 124/76  09/11/21  134/69  09/10/21 111/73   Wt Readings from Last 3 Encounters:   04/05/22 177 lb 12.8 oz (80.6 kg)  09/10/21 189 lb (85.7 kg)  04/26/21 197 lb 8 oz (89.6 kg)   SpO2 Readings from Last 3 Encounters:  04/05/22 98%  09/11/21 100%  09/10/21 98%      Physical Exam Vitals and nursing note reviewed.  Constitutional:      General: She is not in acute distress.    Appearance: Normal appearance. She is normal weight. She is not ill-appearing, toxic-appearing or diaphoretic.  HENT:     Head: Normocephalic and atraumatic.     Right Ear: Tympanic membrane, ear canal and external ear normal. There is no impacted cerumen.     Left Ear: Tympanic membrane, ear canal and external ear normal. There is no impacted cerumen.     Nose: Nose normal. No congestion or rhinorrhea.     Mouth/Throat:     Mouth: Mucous membranes are moist.     Pharynx: Oropharynx is clear. No oropharyngeal exudate or posterior oropharyngeal erythema.  Eyes:     General: No scleral icterus.       Right eye: No discharge.        Left eye: No discharge.     Extraocular Movements: Extraocular movements intact.     Conjunctiva/sclera: Conjunctivae normal.     Pupils: Pupils are equal, round, and reactive to light.  Neck:     Vascular: No carotid bruit.  Cardiovascular:     Rate and Rhythm: Normal rate and regular rhythm.     Pulses: Normal pulses.     Heart sounds: Normal heart sounds. No murmur heard.   No friction rub. No gallop.  Pulmonary:     Effort: Pulmonary effort is normal. No respiratory distress.     Breath sounds: Normal breath sounds. No stridor. No wheezing, rhonchi or rales.  Chest:     Chest wall: No tenderness.  Abdominal:     General: Abdomen is flat. Bowel sounds are normal. There is no distension.     Palpations: There is no mass.     Tenderness: There is no abdominal tenderness. There is no right CVA tenderness, left CVA tenderness, guarding or rebound.     Hernia: No hernia is present.  Musculoskeletal:        General: No swelling, tenderness, deformity or  signs of injury. Normal range of motion.     Cervical back: Normal range of motion and neck supple. No rigidity or tenderness.     Right lower leg: No edema.     Left lower leg: No edema.  Lymphadenopathy:     Cervical: No cervical adenopathy.  Skin:    General: Skin is warm and dry.     Capillary Refill: Capillary refill takes less than 2 seconds.     Coloration: Skin is not jaundiced or pale.     Findings: No bruising, erythema, lesion or rash.  Neurological:     General: No focal deficit present.     Mental Status: She is alert and oriented to person, place, and time. Mental status is at baseline.     Cranial Nerves: No cranial nerve deficit.     Sensory: No sensory deficit.     Motor: No weakness.     Coordination: Coordination normal.     Gait: Gait normal.     Deep Tendon Reflexes: Reflexes normal.  Psychiatric:        Mood and Affect: Mood normal.  Behavior: Behavior normal.        Thought Content: Thought content normal.        Judgment: Judgment normal.     No results found for any visits on 04/05/22.    Assessment & Plan:    Routine Health Maintenance and Physical Exam  Immunization History  Administered Date(s) Administered   Influenza,inj,Quad PF,6+ Mos 09/20/2018   Tdap 07/25/2016, 07/22/2018    Health Maintenance  Topic Date Due   PAP SMEAR-Modifier  11/06/2020   PAP-Cervical Cytology Screening  05/12/2022   COVID-19 Vaccine (1) 04/21/2022 (Originally 12/18/1991)   Hepatitis C Screening  04/06/2023 (Originally 06/18/2011)   INFLUENZA VACCINE  06/06/2022   TETANUS/TDAP  05/17/2031   HIV Screening  Completed   HPV VACCINES  Aged Out    Discussed health benefits of physical activity, and encouraged her to engage in regular exercise appropriate for her age and condition.  Problem List Items Addressed This Visit   None Visit Diagnoses     Annual physical exam    -  Primary      Return in about 1 year (around 04/06/2023) for CPE and labs.      PLAN Exam unremarkable Will obtain labs from recent Gyn visit Follow up annually, sooner if concerns arise Patient encouraged to call clinic with any questions, comments, or concerns.  Janeece Agee, NP

## 2024-01-31 ENCOUNTER — Telehealth (INDEPENDENT_AMBULATORY_CARE_PROVIDER_SITE_OTHER): Payer: Self-pay | Admitting: Primary Care

## 2024-01-31 NOTE — Telephone Encounter (Signed)
 Spoke to pt about appt.. Will be present

## 2024-02-01 ENCOUNTER — Encounter (INDEPENDENT_AMBULATORY_CARE_PROVIDER_SITE_OTHER): Payer: Self-pay | Admitting: Primary Care

## 2024-02-01 ENCOUNTER — Ambulatory Visit (INDEPENDENT_AMBULATORY_CARE_PROVIDER_SITE_OTHER): Admitting: Primary Care

## 2024-02-01 VITALS — BP 106/55 | HR 72 | Ht 65.0 in | Wt 174.4 lb

## 2024-02-01 DIAGNOSIS — D649 Anemia, unspecified: Secondary | ICD-10-CM | POA: Diagnosis not present

## 2024-02-01 DIAGNOSIS — Z1159 Encounter for screening for other viral diseases: Secondary | ICD-10-CM | POA: Diagnosis not present

## 2024-02-01 DIAGNOSIS — F4323 Adjustment disorder with mixed anxiety and depressed mood: Secondary | ICD-10-CM

## 2024-02-01 DIAGNOSIS — Z7689 Persons encountering health services in other specified circumstances: Secondary | ICD-10-CM | POA: Diagnosis not present

## 2024-02-01 MED ORDER — BUSPIRONE HCL 5 MG PO TABS
5.0000 mg | ORAL_TABLET | Freq: Two times a day (BID) | ORAL | 1 refills | Status: AC
Start: 2024-02-01 — End: ?

## 2024-02-01 NOTE — Progress Notes (Signed)
 New Patient Office Visit  Subjective    Patient ID: Deborah Lozano female  DOB: 16-Mar-1991  Age: 33 y.o. MRN: [id number]   CC:  Increased anxiety over the last 3 years previously followed by therapist that did not show any improvement. HPI     New Patient (Initial Visit)    Additional comments: anxiety      Last edited by Ronette Deter, CMA on 02/01/2024 10:21 AM.      HPI  Deborah Lozano is a 33 year old overweight female who presents today to establish care.  She has nothing physically bothering her but may need psychological help treatment with anxiety.  Blood pressure unremarkable. Patient has No headache, No chest pain, No abdominal pain - No Nausea, No new weakness tingling or numbness, No Cough - shortness of breath  Current Outpatient Medications on File Prior to Visit  Medication Sig Dispense Refill   [DISCONTINUED] norelgestromin-ethinyl estradiol (ORTHO EVRA) 150-35 MCG/24HR transdermal patch Place 1 patch onto the skin once a week. For 3 weeks, then 1 week without patch 9 patch 3   No current facility-administered medications on file prior to visit.     No Known Allergies  Past Medical History:  Diagnosis Date   Carpal tunnel syndrome during pregnancy      Past Surgical History:  Procedure Laterality Date   NO PAST SURGERIES       Family History  Problem Relation Age of Onset   Cancer Paternal Aunt    Diabetes Paternal Aunt    Diabetes Maternal Grandfather    Diabetes Paternal Grandmother    Alcohol abuse Neg Hx    Arthritis Neg Hx    Asthma Neg Hx    Birth defects Neg Hx    COPD Neg Hx    Depression Neg Hx    Drug abuse Neg Hx    Early death Neg Hx    Hearing loss Neg Hx    Heart disease Neg Hx    Hyperlipidemia Neg Hx    Hypertension Neg Hx    Kidney disease Neg Hx    Learning disabilities Neg Hx    Mental illness Neg Hx    Mental retardation Neg Hx    Miscarriages / Stillbirths Neg Hx    Stroke Neg Hx    Vision loss Neg Hx     Varicose Veins Neg Hx     Social History   Socioeconomic History   Marital status: Single    Spouse name: Not on file   Number of children: 2   Years of education: Not on file   Highest education level: Not on file  Occupational History   Not on file  Tobacco Use   Smoking status: Former    Current packs/day: 0.00    Types: Cigarettes    Quit date: 06/07/2015    Years since quitting: 8.6   Smokeless tobacco: Never  Vaping Use   Vaping status: Never Used  Substance and Sexual Activity   Alcohol use: Yes    Comment: 2 glassess of wine a week   Drug use: No   Sexual activity: Yes    Comment: last IC 09/17/18  Other Topics Concern   Not on file  Social History Narrative   Not on file   Social Drivers of Health   Financial Resource Strain: Low Risk  (08/18/2019)   Overall Financial Resource Strain (CARDIA)    Difficulty of Paying Living Expenses: Not hard at all  Food Insecurity:  No Food Insecurity (08/18/2019)   Hunger Vital Sign    Worried About Running Out of Food in the Last Year: Never true    Ran Out of Food in the Last Year: Never true  Transportation Needs: No Transportation Needs (08/18/2019)   PRAPARE - Administrator, Civil Service (Medical): No    Lack of Transportation (Non-Medical): No  Physical Activity: Sufficiently Active (08/18/2019)   Exercise Vital Sign    Days of Exercise per Week: 5 days    Minutes of Exercise per Session: 60 min  Stress: No Stress Concern Present (08/18/2019)   [employer] of Occupational Health - Occupational Stress Questionnaire    Feeling of Stress : Only a little  Social Connections: Unknown (08/18/2019)   Social Connection and Isolation Panel [NHANES]    Frequency of Communication with Friends and Family: Three times a week    Frequency of Social Gatherings with Friends and Family: Twice a week    Attends Religious Services: Patient declined    Active Member of Clubs or Organizations: Patient declined     Attends Banker Meetings: Patient declined    Marital Status: Never married  Intimate Partner Violence: Not At Risk (08/18/2019)   Humiliation, Afraid, Rape, and Kick questionnaire    Fear of Current or Ex-Partner: No    Emotionally Abused: No    Physically Abused: No    Sexually Abused: No       Health Maintenance  Topic Date Due   HPV Vaccine (2 - 3-dose SCDM series) 05/02/2022   Pap with HPV screening  Never done   COVID-19 Vaccine (1 - 2024-25 season) Never done   Flu Shot  06/06/2024   DTaP/Tdap/Td vaccine (4 - Td or Tdap) 05/17/2031   Hepatitis C Screening  Completed   HIV Screening  Completed    Objective    BP (!) 106/55   Pulse 72   Ht 5\' 5"  (1.651 m)   Wt 174 lb 6.4 oz (79.1 kg)   LMP 01/11/2024   SpO2 100%   BMI 29.02 kg/m   Physical Exam Vitals reviewed.  Constitutional:      Appearance: Normal appearance.     Comments: Over weight  HENT:     Head: Normocephalic.     Right Ear: Tympanic membrane, ear canal and external ear normal.     Left Ear: Tympanic membrane, ear canal and external ear normal.     Nose: Nose normal.     Mouth/Throat:     Mouth: Mucous membranes are moist.  Eyes:     Extraocular Movements: Extraocular movements intact.     Pupils: Pupils are equal, round, and reactive to light.  Cardiovascular:     Rate and Rhythm: Normal rate.  Pulmonary:     Effort: Pulmonary effort is normal.     Breath sounds: Normal breath sounds.  Abdominal:     General: Bowel sounds are normal.     Palpations: Abdomen is soft.  Musculoskeletal:        General: Normal range of motion.     Cervical back: Normal range of motion.  Skin:    General: Skin is warm and dry.  Neurological:     Mental Status: She is alert and oriented to person, place, and time.  Psychiatric:        Mood and Affect: Mood normal.        Behavior: Behavior normal.        Thought Content: Thought content  normal.      Assessment & Plan:  Dyanna was  seen today for new patient (initial visit).  Diagnoses and all orders for this visit:  Encounter to establish care  Anemia, unspecified type -     CBC with Differential -     CMP14+EGFR  Adjustment disorder with mixed anxiety and depressed mood -     busPIRone (BUSPAR) 5 MG tablet; Take 1 tablet (5 mg total) by mouth 2 (two) times daily.  Encounter for HCV screening test for low risk patient -     HCV Ab w Reflex to Quant PCR  Other orders -     Interpretation:      Follow-up:  Return in about 6 weeks (around 03/14/2024), or medication.  The above assessment and management plan was discussed with the patient. The patient verbalized understanding of and has agreed to the management plan. Patient is aware to call the clinic if symptoms fail to improve or worsen. Patient is aware when to return to the clinic for a follow-up visit. Patient educated on when it is appropriate to go to the emergency department.   Gwinda Passe, NP-C

## 2024-02-01 NOTE — Patient Instructions (Signed)
 Buspirone Tablets What is this medication? BUSPIRONE (byoo SPYE rone) treats anxiety. It works by balancing the levels of dopamine and serotonin in your brain, substances that help regulate mood. This medicine may be used for other purposes; ask your health care provider or pharmacist if you have questions. COMMON BRAND NAME(S): BuSpar, Buspar Dividose What should I tell my care team before I take this medication? They need to know if you have any of these conditions: Kidney or liver disease An unusual or allergic reaction to buspirone, other medications, foods, dyes, or preservatives Pregnant or trying to get pregnant Breast-feeding How should I use this medication? Take this medication by mouth with a glass of water. Follow the directions on the prescription label. You may take this medication with or without food. To ensure that this medication always works the same way for you, you should take it either always with or always without food. Take your doses at regular intervals. Do not take your medication more often than directed. Do not stop taking except on the advice of your care team. Talk to your care team about the use of this medication in children. Special care may be needed. Overdosage: If you think you have taken too much of this medicine contact a poison control center or emergency room at once. NOTE: This medicine is only for you. Do not share this medicine with others. What if I miss a dose? If you miss a dose, take it as soon as you can. If it is almost time for your next dose, take only that dose. Do not take double or extra doses. What may interact with this medication? Do not take this medication with any of the following: Linezolid MAOIs like Carbex, Eldepryl, Marplan, Nardil, and Parnate Methylene blue Procarbazine This medication may also interact with the following: Diazepam Digoxin Diltiazem Erythromycin Grapefruit juice Haloperidol Medications for mental  depression or mood problems Medications for seizures like carbamazepine, phenobarbital and phenytoin Nefazodone Other medications for anxiety Rifampin Ritonavir Some antifungal medications like itraconazole, ketoconazole, and voriconazole Verapamil Warfarin This list may not describe all possible interactions. Give your health care provider a list of all the medicines, herbs, non-prescription drugs, or dietary supplements you use. Also tell them if you smoke, drink alcohol, or use illegal drugs. Some items may interact with your medicine. What should I watch for while using this medication? Visit your care team for regular checks on your progress. It may take 1 to 2 weeks before your anxiety gets better. This medication may affect your coordination, reaction time, or judgment. Do not drive or operate machinery until you know how this medication affects you. Sit up or stand slowly to reduce the risk of dizzy or fainting spells. Drinking alcohol with this medication can increase the risk of these side effects. What side effects may I notice from receiving this medication? Side effects that you should report to your care team as soon as possible: Allergic reactions--skin rash, itching, hives, swelling of the face, lips, tongue, or throat Irritability, confusion, fast or irregular heartbeat, muscle stiffness, twitching muscles, sweating, high fever, seizure, chills, vomiting, diarrhea, which may be signs of serotonin syndrome Side effects that usually do not require medical attention (report to your care team if they continue or are bothersome): Anxiety, nervousness Dizziness Drowsiness Headache Nausea Trouble sleeping This list may not describe all possible side effects. Call your doctor for medical advice about side effects. You may report side effects to FDA at 1-800-FDA-1088. Where should I keep  my medication? Keep out of the reach of children. Store at room temperature below 30 degrees C  (86 degrees F). Protect from light. Keep container tightly closed. Throw away any unused medication after the expiration date. NOTE: This sheet is a summary. It may not cover all possible information. If you have questions about this medicine, talk to your doctor, pharmacist, or health care provider.  2024 Elsevier/Gold Standard (2022-05-15 00:00:00)

## 2024-02-02 LAB — CBC WITH DIFFERENTIAL/PLATELET
Basophils Absolute: 0 10*3/uL (ref 0.0–0.2)
Basos: 0 %
EOS (ABSOLUTE): 0.1 10*3/uL (ref 0.0–0.4)
Eos: 1 %
Hematocrit: 40.9 % (ref 34.0–46.6)
Hemoglobin: 13.6 g/dL (ref 11.1–15.9)
Immature Grans (Abs): 0 10*3/uL (ref 0.0–0.1)
Immature Granulocytes: 0 %
Lymphocytes Absolute: 2.9 10*3/uL (ref 0.7–3.1)
Lymphs: 35 %
MCH: 30.8 pg (ref 26.6–33.0)
MCHC: 33.3 g/dL (ref 31.5–35.7)
MCV: 93 fL (ref 79–97)
Monocytes Absolute: 0.4 10*3/uL (ref 0.1–0.9)
Monocytes: 5 %
Neutrophils Absolute: 4.9 10*3/uL (ref 1.4–7.0)
Neutrophils: 59 %
Platelets: 227 10*3/uL (ref 150–450)
RBC: 4.41 x10E6/uL (ref 3.77–5.28)
RDW: 12.4 % (ref 11.7–15.4)
WBC: 8.3 10*3/uL (ref 3.4–10.8)

## 2024-02-02 LAB — CMP14+EGFR
ALT: 13 IU/L (ref 0–32)
AST: 16 IU/L (ref 0–40)
Albumin: 4.3 g/dL (ref 4.0–5.0)
Alkaline Phosphatase: 52 IU/L (ref 44–121)
BUN/Creatinine Ratio: 14 (ref 9–23)
BUN: 10 mg/dL (ref 6–20)
Bilirubin Total: 0.3 mg/dL (ref 0.0–1.2)
CO2: 19 mmol/L — ABNORMAL LOW (ref 20–29)
Calcium: 9.4 mg/dL (ref 8.7–10.2)
Chloride: 103 mmol/L (ref 96–106)
Creatinine, Ser: 0.74 mg/dL (ref 0.57–1.00)
Globulin, Total: 3 g/dL (ref 1.5–4.5)
Glucose: 119 mg/dL — ABNORMAL HIGH (ref 70–99)
Potassium: 4.1 mmol/L (ref 3.5–5.2)
Sodium: 139 mmol/L (ref 134–144)
Total Protein: 7.3 g/dL (ref 6.0–8.5)
eGFR: 112 mL/min/{1.73_m2} (ref >59)

## 2024-02-02 LAB — HCV INTERPRETATION

## 2024-02-02 LAB — HCV AB W REFLEX TO QUANT PCR: HCV Ab: NONREACTIVE

## 2024-02-06 ENCOUNTER — Encounter (INDEPENDENT_AMBULATORY_CARE_PROVIDER_SITE_OTHER): Payer: Self-pay | Admitting: Primary Care

## 2024-02-23 ENCOUNTER — Other Ambulatory Visit (INDEPENDENT_AMBULATORY_CARE_PROVIDER_SITE_OTHER): Payer: Self-pay | Admitting: Primary Care

## 2024-02-23 DIAGNOSIS — F4323 Adjustment disorder with mixed anxiety and depressed mood: Secondary | ICD-10-CM

## 2024-02-25 NOTE — Telephone Encounter (Signed)
 Requested Prescriptions  Pending Prescriptions Disp Refills   busPIRone  (BUSPAR ) 5 MG tablet [Pharmacy Med Name: BUSPIRONE  HCL 5 MG TABLET] 180 tablet 1    Sig: TAKE 1 TABLET BY MOUTH TWICE A DAY     Psychiatry: Anxiolytics/Hypnotics - Non-controlled Failed - 02/25/2024 11:54 AM      Failed - Valid encounter within last 12 months    Recent Outpatient Visits           3 weeks ago Encounter to establish care   [facility] Marius Siemens, NP   4 years ago HSV-2 seropositive   [facility], Rich Champ, NP   4 years ago Annual physical exam   [facility], Rich Champ, NP   4 years ago Allergic dermatitis   [facility], Richard, NP   6 years ago Trochanteric bursitis of left hip   [facility], Marguerita Shih, MD

## 2024-03-18 ENCOUNTER — Ambulatory Visit (INDEPENDENT_AMBULATORY_CARE_PROVIDER_SITE_OTHER): Admitting: Primary Care

## 2024-04-13 ENCOUNTER — Other Ambulatory Visit (INDEPENDENT_AMBULATORY_CARE_PROVIDER_SITE_OTHER): Payer: Self-pay | Admitting: Primary Care

## 2024-04-13 DIAGNOSIS — F4323 Adjustment disorder with mixed anxiety and depressed mood: Secondary | ICD-10-CM

## 2024-06-25 ENCOUNTER — Institutional Professional Consult (permissible substitution): Admitting: Plastic Surgery

## 2024-08-21 ENCOUNTER — Ambulatory Visit (INDEPENDENT_AMBULATORY_CARE_PROVIDER_SITE_OTHER): Admitting: Plastic Surgery

## 2024-08-21 ENCOUNTER — Encounter: Payer: Self-pay | Admitting: Plastic Surgery

## 2024-08-21 VITALS — BP 118/65 | HR 69 | Temp 99.0°F | Wt 173.0 lb

## 2024-08-21 DIAGNOSIS — L987 Excessive and redundant skin and subcutaneous tissue: Secondary | ICD-10-CM | POA: Diagnosis not present

## 2024-08-21 DIAGNOSIS — M6208 Separation of muscle (nontraumatic), other site: Secondary | ICD-10-CM

## 2024-08-21 DIAGNOSIS — M793 Panniculitis, unspecified: Secondary | ICD-10-CM

## 2024-08-21 DIAGNOSIS — L304 Erythema intertrigo: Secondary | ICD-10-CM | POA: Diagnosis not present

## 2024-08-21 DIAGNOSIS — E65 Localized adiposity: Secondary | ICD-10-CM | POA: Diagnosis not present

## 2024-08-21 DIAGNOSIS — R21 Rash and other nonspecific skin eruption: Secondary | ICD-10-CM

## 2024-08-21 NOTE — Progress Notes (Signed)
 Referring Provider Celestia Rosaline SQUIBB, NP [address] [place],  [place] [address]   CC:  Chief Complaint  Patient presents with   Consult      Deborah Lozano is an 33 y.o. female.  HPI: Deborah Lozano is a 33 year old female who presents today with complaints of persistent sweating and chafing underneath her pannus.  She states that this has been a problem since the birth of her first child in 2017.  She states that she uses over-the-counter creams and powders to help with the chafing.  These have been largely unsuccessful.  She is also unhappy with the appearance of her abdomen and the fact that she is unable to find close that fit appropriately.  She would like to have the skin and fat removed.  She denies any past history of DVT, any family history of DVT, any unexplained pregnancy losses, and her only steroids are birth control pills.  No Known Allergies  Outpatient Encounter Medications as of 08/21/2024  Medication Sig   busPIRone  (BUSPAR ) 5 MG tablet Take 1 tablet (5 mg total) by mouth 2 (two) times daily.   [DISCONTINUED] norelgestromin -ethinyl estradiol  (ORTHO EVRA) 150-35 MCG/24HR transdermal patch Place 1 patch onto the skin once a week. For 3 weeks, then 1 week without patch   No facility-administered encounter medications on file as of 08/21/2024.     Past Medical History:  Diagnosis Date   Carpal tunnel syndrome during pregnancy     Past Surgical History:  Procedure Laterality Date   NO PAST SURGERIES      Family History  Problem Relation Age of Onset   Cancer Paternal Aunt    Diabetes Paternal Aunt    Diabetes Maternal Grandfather    Diabetes Paternal Grandmother    Alcohol abuse Neg Hx    Arthritis Neg Hx    Asthma Neg Hx    Birth defects Neg Hx    COPD Neg Hx    Depression Neg Hx    Drug abuse Neg Hx    Early death Neg Hx    Hearing loss Neg Hx    Heart disease Neg Hx    Hyperlipidemia Neg Hx    Hypertension Neg Hx    Kidney disease Neg Hx     Learning disabilities Neg Hx    Mental illness Neg Hx    Mental retardation Neg Hx    Miscarriages / Stillbirths Neg Hx    Stroke Neg Hx    Vision loss Neg Hx    Varicose Veins Neg Hx     Social History   Social History Narrative   Not on file     Review of Systems General: Denies fevers, chills, weight loss CV: Denies chest pain, shortness of breath, palpitations Abdomen: Excess skin and fat on the anterior abdominal wall particularly in the midline.  States that she frequently has rashes and chafing on the posterior aspect of the skin and fat  Physical Exam    08/21/2024    1:28 PM 02/01/2024   10:21 AM 04/05/2022    2:42 PM  Vitals with BMI  Height  5' 5 5' 5  Weight 173 lbs 174 lbs 6 oz 177 lbs 13 oz  BMI  29.02 29.59  Systolic 118 106 875  Diastolic 65 55 76  Pulse 69 72 77    General:  No acute distress,  Alert and oriented, Non-Toxic, Normal speech and affect Abdomen: Patient has a small pannus which hangs below the level of  the hairbearing area and just above the symphysis pubis.  There is discoloration in the skin consistent with previous infections.  There is no rash today.  Patient has a moderate rectus diastases but no palpable hernias Mammogram: Not applicable due to age Assessment/Plan Excess skin and fat anterior abdominal wall, rectus diastases: Patient has a small pannus and would likely benefit from removal of this excess skin and fat.  She would also be an excellent candidate for an abdominoplasty.  I have discussed both procedures with her showing the location of the incision across the lower portion of the abdomen.  I showed her how the skin and fat would be excised for panniculectomy and what additional incisions including the umbilical incision and the elevation of the skin and fat over the rectus muscle would look like.  We discussed rectus plication.  We discussed the risks of bleeding, infection, and seroma formation.  She understands she will have 2  drains postoperatively and will have an abdominal binder for abdominal compression.  The drains may be in place up to 4 weeks and the compression will be necessary for 6 weeks.  Her postoperative limitations will include no heavy lifting greater than 20 pounds, no vigorous activity such as running dancing or jumping, and no submerging the incisions in water for 6 weeks.  She may return to light activity including work from home as she feels comfortable.  She will be encouraged to begin ambulation immediately after surgery to help prevent DVT.  All questions were answered to her satisfaction.  Photographs were obtained today with her consent.  Will submit her for panniculectomy and send her quotes for upper abdominal add-on as well as abdominoplasty.  Deborah Lozano 08/21/2024, 1:51 PM
